# Patient Record
Sex: Male | Born: 1965 | Race: Black or African American | Hispanic: No | Marital: Single | Smoking: Never smoker
Health system: Southern US, Community
[De-identification: ages and names within clinical notes are randomized; demographics above are authoritative.]

## PROBLEM LIST (undated history)

## (undated) DIAGNOSIS — T79A22A Traumatic compartment syndrome of left lower extremity, initial encounter: Secondary | ICD-10-CM

## (undated) DIAGNOSIS — I10 Essential (primary) hypertension: Secondary | ICD-10-CM

## (undated) DIAGNOSIS — N289 Disorder of kidney and ureter, unspecified: Secondary | ICD-10-CM

## (undated) HISTORY — PX: COMPARTMENT SYNDROME MEASUREMENT: CATH118296

---

## 2001-05-15 ENCOUNTER — Emergency Department (HOSPITAL_COMMUNITY): Admission: EM | Admit: 2001-05-15 | Discharge: 2001-05-16 | Payer: Self-pay | Admitting: Emergency Medicine

## 2001-06-25 ENCOUNTER — Encounter: Payer: Self-pay | Admitting: Internal Medicine

## 2001-06-25 ENCOUNTER — Emergency Department (HOSPITAL_COMMUNITY): Admission: EM | Admit: 2001-06-25 | Discharge: 2001-06-25 | Payer: Self-pay | Admitting: Internal Medicine

## 2003-01-24 ENCOUNTER — Emergency Department (HOSPITAL_COMMUNITY): Admission: EM | Admit: 2003-01-24 | Discharge: 2003-01-25 | Payer: Self-pay | Admitting: *Deleted

## 2003-07-18 ENCOUNTER — Emergency Department (HOSPITAL_COMMUNITY): Admission: AC | Admit: 2003-07-18 | Discharge: 2003-07-19 | Payer: Self-pay

## 2004-05-09 ENCOUNTER — Emergency Department (HOSPITAL_COMMUNITY): Admission: EM | Admit: 2004-05-09 | Discharge: 2004-05-09 | Payer: Self-pay | Admitting: Emergency Medicine

## 2004-07-31 ENCOUNTER — Ambulatory Visit (HOSPITAL_COMMUNITY): Admission: RE | Admit: 2004-07-31 | Discharge: 2004-07-31 | Payer: Self-pay | Admitting: Internal Medicine

## 2008-04-08 ENCOUNTER — Emergency Department (HOSPITAL_COMMUNITY): Admission: EM | Admit: 2008-04-08 | Discharge: 2008-04-09 | Payer: Self-pay | Admitting: Emergency Medicine

## 2008-11-08 ENCOUNTER — Encounter: Admission: RE | Admit: 2008-11-08 | Discharge: 2008-11-08 | Payer: Self-pay | Admitting: Sports Medicine

## 2010-06-13 LAB — URINE CULTURE
Colony Count: NO GROWTH
Culture: NO GROWTH

## 2010-06-13 LAB — URINALYSIS, ROUTINE W REFLEX MICROSCOPIC
Glucose, UA: NEGATIVE mg/dL
Ketones, ur: NEGATIVE mg/dL
Nitrite: NEGATIVE
pH: 6.5 (ref 5.0–8.0)

## 2012-05-27 ENCOUNTER — Emergency Department (HOSPITAL_COMMUNITY)
Admission: EM | Admit: 2012-05-27 | Discharge: 2012-05-28 | Disposition: A | Discharge status: Home / Self Care | Payer: Self-pay | Attending: Emergency Medicine | Admitting: Emergency Medicine

## 2012-05-27 ENCOUNTER — Encounter (HOSPITAL_COMMUNITY): Payer: Self-pay

## 2012-05-27 DIAGNOSIS — L259 Unspecified contact dermatitis, unspecified cause: Secondary | ICD-10-CM | POA: Insufficient documentation

## 2012-05-28 MED ORDER — HYDROXYZINE HCL 25 MG PO TABS
25.0000 mg | ORAL_TABLET | Freq: Once | ORAL | Status: AC
  Administered 2012-05-28: 25 mg via ORAL
  Filled 2012-05-28: qty 1

## 2012-05-28 MED ORDER — HYDROXYZINE HCL 25 MG PO TABS: 25.0000 mg | ORAL_TABLET | Freq: Four times a day (QID) | ORAL | Status: DC | PRN

## 2012-05-28 MED ORDER — PREDNISONE 50 MG PO TABS
60.0000 mg | ORAL_TABLET | Freq: Once | ORAL | Status: AC
  Administered 2012-05-28: 60 mg via ORAL
  Filled 2012-05-28: qty 1

## 2012-05-28 MED ORDER — PREDNISONE 10 MG PO TABS: ORAL_TABLET | ORAL | Status: DC

## 2012-05-28 NOTE — ED Notes (Signed)
Instructions, prescriptions reviewed and f/u information provided - verbalizes understanding.

## 2012-05-28 NOTE — ED Provider Notes (Signed)
History     CSN: [id number]  Arrival date & time 05/27/12  2347   First MD Initiated Contact with Patient 05/28/12 0008      Chief Complaint  Patient presents with  . Rash    (Consider location/radiation/quality/duration/timing/severity/associated sxs/prior treatment) HPI Comments: Patient c/o red itchy rash to most of his body for several weeks.  States the rash began after using a different brand of detergent.  He states he has used OTC anti-itch medication, hydrocortisone cream, and benadryl w/o relief.  He denies swelling, difficulty swallowing or breathing, exposure to similar rash or recent illness.    Patient is a 47 y.o. male presenting with rash. The history is provided by the patient.  Rash Location:  Full body Quality: itchiness and redness   Quality: not blistering, not bruising, not burning, not draining, not dry, not painful, not peeling, not scaling, not swelling and not weeping   Severity:  Mild Onset quality:  Gradual Timing:  Constant Progression:  Unchanged Chronicity:  New Context: new detergent/soap   Context: not exposure to similar rash, not hot tub use, not insect bite/sting, not medications, not plant contact and not sick contacts   Relieved by:  Nothing Worsened by:  Heat Ineffective treatments:  Anti-itch cream and OTC analgesics Associated symptoms: no abdominal pain, no fever, no headaches, no joint pain, no myalgias, no nausea, no shortness of breath, no sore throat, no throat swelling, no tongue swelling, no URI, not vomiting and not wheezing     History reviewed. No pertinent past medical history.  History reviewed. No pertinent past surgical history.  No family history on file.  History  Substance Use Topics  . Smoking status: Never Smoker   . Smokeless tobacco: Not on file  . Alcohol Use: Yes      Review of Systems  Constitutional: Negative for fever, chills, activity change and appetite change.  HENT: Negative for sore throat,  facial swelling, trouble swallowing, neck pain and neck stiffness.   Respiratory: Negative for chest tightness, shortness of breath and wheezing.   Cardiovascular: Negative for chest pain and leg swelling.  Gastrointestinal: Negative for nausea, vomiting and abdominal pain.  Musculoskeletal: Negative for myalgias and arthralgias.  Skin: Positive for rash. Negative for wound.  Neurological: Negative for dizziness, speech difficulty, weakness, numbness and headaches.  All other systems reviewed and are negative.    Allergies  Review of patient's allergies indicates no known allergies.  Home Medications  No current outpatient prescriptions on file.  BP 168/108  Pulse 112  Temp(Src) 98 F (36.7 C) (Oral)  Resp 20  Ht 5\' 5"  (1.651 m)  Wt 225 lb (102.059 kg)  BMI 37.44 kg/m2  SpO2 96%  Physical Exam  Nursing note and vitals reviewed. Constitutional: He is oriented to person, place, and time. He appears well-developed and well-nourished. No distress.  HENT:  Head: Normocephalic and atraumatic.  Mouth/Throat: Oropharynx is clear and moist.  Eyes: EOM are normal. Pupils are equal, round, and reactive to light.  Neck: Normal range of motion. Neck supple.  Cardiovascular: Normal rate, regular rhythm, normal heart sounds and intact distal pulses.   No murmur heard. Pulmonary/Chest: Effort normal and breath sounds normal.  Musculoskeletal: He exhibits no edema and no tenderness.  Lymphadenopathy:    He has no cervical adenopathy.  Neurological: He is alert and oriented to person, place, and time. He exhibits normal muscle tone. Coordination normal.  Skin: Rash noted. There is erythema.  Erythematous maculopapular rash to the extremities,  back and chest.  Palms, sole of feet and face are spared.  No vesicles, pustules or peticheia      ED Course  Procedures (including critical care time)  Labs Reviewed - No data to display No results found.      MDM   Diffuse maculopapular  rash to most of the body.  Several areas of excoriations.  Appears c/w contact dermatitis.  No edema.  Airway patent.    I will treat with prednisone taper and vistaril.  He agrees to d/c the OTC medications and to f/u with his PMD for recheck.  The patient appears reasonably screened and/or stabilized for discharge and I doubt any other medical condition or other [facility] requiring further screening, evaluation, or treatment in the ED at this time prior to discharge.        Jocelyn Nold L. Avaline Stillson, PA-C 05/28/12 0034

## 2012-05-28 NOTE — ED Provider Notes (Signed)
Medical screening examination/treatment/procedure(s) were performed by non-physician practitioner and as supervising physician I was immediately available for consultation/collaboration.   Joya Gaskins, MD 05/28/12 [phone]

## 2012-06-08 ENCOUNTER — Encounter (HOSPITAL_COMMUNITY): Payer: Self-pay | Admitting: *Deleted

## 2012-06-08 ENCOUNTER — Emergency Department (HOSPITAL_COMMUNITY)
Admission: EM | Admit: 2012-06-08 | Discharge: 2012-06-09 | Disposition: A | Discharge status: Home / Self Care | Payer: Self-pay | Attending: Emergency Medicine | Admitting: Emergency Medicine

## 2012-06-08 DIAGNOSIS — L739 Follicular disorder, unspecified: Secondary | ICD-10-CM

## 2012-06-08 DIAGNOSIS — L738 Other specified follicular disorders: Secondary | ICD-10-CM | POA: Insufficient documentation

## 2012-06-08 DIAGNOSIS — Z872 Personal history of diseases of the skin and subcutaneous tissue: Secondary | ICD-10-CM | POA: Insufficient documentation

## 2012-06-08 DIAGNOSIS — R000 Tachycardia, unspecified: Secondary | ICD-10-CM | POA: Insufficient documentation

## 2012-06-08 NOTE — ED Notes (Signed)
Pt c/o itchy rash on abdomen and groin x 2 months.  Was seen at [facility], given prednisone and "medicine for itching".  States rash is not improving but spreading.

## 2012-06-09 MED ORDER — CEPHALEXIN 250 MG PO CAPS
500.0000 mg | ORAL_CAPSULE | Freq: Once | ORAL | Status: AC
  Administered 2012-06-09: 500 mg via ORAL
  Filled 2012-06-09: qty 2

## 2012-06-09 MED ORDER — CEPHALEXIN 500 MG PO CAPS: 500.0000 mg | ORAL_CAPSULE | Freq: Four times a day (QID) | ORAL | Status: DC

## 2012-06-09 MED ORDER — HYDROXYZINE HCL 25 MG PO TABS: 25.0000 mg | ORAL_TABLET | Freq: Four times a day (QID) | ORAL | Status: DC | PRN

## 2012-06-09 NOTE — ED Provider Notes (Signed)
History     CSN: [id number]  Arrival date & time 06/08/12  2124   First MD Initiated Contact with Patient 06/08/12 2334      Chief Complaint  Patient presents with  . Rash    (Consider location/radiation/quality/duration/timing/severity/associated sxs/prior treatment) HPI Comments: Patient has a periodic rash.  That started in his perineal area, and groin.  2 months ago.  It is spread to his abdomen and buttock under his arms in the axilla.  [facility] with a contact dermatitis started on Vistaril and steroids, which he's completed the course states, that Vistaril did help the itching, but this year.  I did nothing to resolve.  The rash.  Denies any generalized, fever, body aches  Patient is a 47 y.o. male presenting with rash. The history is provided by the patient.  Rash Location:  Ano-genital Associated symptoms: no fever and no myalgias     History reviewed. No pertinent past medical history.  History reviewed. No pertinent past surgical history.  History reviewed. No pertinent family history.  History  Substance Use Topics  . Smoking status: Never Smoker   . Smokeless tobacco: Not on file  . Alcohol Use: Yes      Review of Systems  Constitutional: Negative for fever and chills.  Musculoskeletal: Negative for myalgias.  Skin: Positive for rash.  All other systems reviewed and are negative.    Allergies  Review of patient's allergies indicates no known allergies.  Home Medications   Current Outpatient Rx  Name  Route  Sig  Dispense  Refill  . naproxen sodium (ANAPROX) 220 MG tablet   Oral   Take 440 mg by mouth 2 (two) times daily as needed (for pain).         . cephALEXin (KEFLEX) 500 MG capsule   Oral   Take 1 capsule (500 mg total) by mouth 4 (four) times daily.   28 capsule   0   . hydrOXYzine (ATARAX/VISTARIL) 25 MG tablet   Oral   Take 1 tablet (25 mg total) by mouth every 6 (six) hours as needed for  itching.   15 tablet   0     BP 149/98  Pulse 112  Temp(Src) 98.9 F (37.2 C) (Oral)  Resp 15  SpO2 95%  Physical Exam  Constitutional: He appears well-developed and well-nourished.  Obese  HENT:  Head: Normocephalic.  Eyes: Pupils are equal, round, and reactive to light.  Neck: Normal range of motion.  Cardiovascular: Tachycardia present.   Pulmonary/Chest: Effort normal.  Abdominal: Soft. Bowel sounds are normal. He exhibits no distension. There is no tenderness.  Musculoskeletal: Normal range of motion.  Neurological: He is alert.  Skin: Skin is warm and dry. No erythema.  Patient has several scabbed over areas on his buttock from scratching.  The rash seems to be at the base of hair shafts in his pelvic region.  Inner thighs, leading me to believe that this is a folliculitis.  We'll treat with antibiotic    ED Course  Procedures (including critical care time)  Labs Reviewed - No data to display No results found.   1. Folliculitis       MDM   We'll start Keflex 500 mg 3 times a day to treat folliculitis and encourage patient to followup with his primary care physician        Arman Filter, NP 06/09/12 [phone]

## 2012-06-09 NOTE — ED Provider Notes (Signed)
Medical screening examination/treatment/procedure(s) were performed by non-physician practitioner and as supervising physician I was immediately available for consultation/collaboration.  Rosaisela Jamroz M Jenilyn Magana, MD 06/09/12 0625 

## 2012-06-09 NOTE — ED Notes (Addendum)
The patient is AOx4 and comfortable with his discharge instructions. 

## 2012-12-05 ENCOUNTER — Emergency Department (HOSPITAL_COMMUNITY): Payer: Self-pay

## 2012-12-05 ENCOUNTER — Encounter (HOSPITAL_COMMUNITY): Payer: Self-pay | Admitting: Emergency Medicine

## 2012-12-05 DIAGNOSIS — I10 Essential (primary) hypertension: Secondary | ICD-10-CM | POA: Diagnosis present

## 2012-12-05 DIAGNOSIS — T79A29A Traumatic compartment syndrome of unspecified lower extremity, initial encounter: Principal | ICD-10-CM | POA: Diagnosis present

## 2012-12-05 DIAGNOSIS — W19XXXA Unspecified fall, initial encounter: Secondary | ICD-10-CM | POA: Diagnosis present

## 2012-12-05 DIAGNOSIS — M216X9 Other acquired deformities of unspecified foot: Secondary | ICD-10-CM | POA: Diagnosis present

## 2012-12-05 LAB — CBC WITH DIFFERENTIAL/PLATELET
Basophils Relative: 0 % (ref 0–1)
Eosinophils Absolute: 0.1 10*3/uL (ref 0.0–0.7)
Eosinophils Relative: 1 % (ref 0–5)
Hemoglobin: 14.9 g/dL (ref 13.0–17.0)
Lymphs Abs: 3.3 10*3/uL (ref 0.7–4.0)
MCH: 29.7 pg (ref 26.0–34.0)
MCHC: 34.5 g/dL (ref 30.0–36.0)
MCV: 86.1 fL (ref 78.0–100.0)
Monocytes Relative: 5 % (ref 3–12)
Neutrophils Relative %: 78 % — ABNORMAL HIGH (ref 43–77)
Platelets: 252 10*3/uL (ref 150–400)
RBC: 5.02 MIL/uL (ref 4.22–5.81)

## 2012-12-05 LAB — COMPREHENSIVE METABOLIC PANEL
Albumin: 3.9 g/dL (ref 3.5–5.2)
BUN: 19 mg/dL (ref 6–23)
Calcium: 8.8 mg/dL (ref 8.4–10.5)
GFR calc Af Amer: 49 mL/min — ABNORMAL LOW (ref >90)
Glucose, Bld: 157 mg/dL — ABNORMAL HIGH (ref 70–99)
Total Protein: 7.6 g/dL (ref 6.0–8.3)

## 2012-12-05 MED ORDER — OXYCODONE-ACETAMINOPHEN 5-325 MG PO TABS
1.0000 | ORAL_TABLET | Freq: Once | ORAL | Status: AC
  Administered 2012-12-05: 1 via ORAL
  Filled 2012-12-05: qty 1

## 2012-12-05 NOTE — ED Notes (Signed)
Fall from ladder. approx 98feet. Into bushes. Possible to head. PT a/o at this time. Standing with difficulty. PT states that he was drinking etoh earlier (1/2-1 pint). Does NOT appear intoxicated at this time. Nausea present. No emesis.  NO CP, SOB, obvious deformity. Left leg pain

## 2012-12-06 ENCOUNTER — Inpatient Hospital Stay (HOSPITAL_COMMUNITY)
Admission: EM | Admit: 2012-12-06 | Discharge: 2012-12-08 | DRG: 909 | Disposition: A | Discharge status: Home / Self Care | Payer: Self-pay | Attending: Orthopaedic Surgery | Admitting: Orthopaedic Surgery

## 2012-12-06 ENCOUNTER — Encounter (HOSPITAL_COMMUNITY)
Admission: EM | Disposition: A | Discharge status: Home / Self Care | Payer: Self-pay | Source: Home / Self Care | Attending: Orthopaedic Surgery

## 2012-12-06 ENCOUNTER — Encounter (HOSPITAL_COMMUNITY): Payer: Self-pay | Admitting: Anesthesiology

## 2012-12-06 ENCOUNTER — Emergency Department (HOSPITAL_COMMUNITY): Payer: Self-pay

## 2012-12-06 ENCOUNTER — Emergency Department (HOSPITAL_COMMUNITY): Payer: Self-pay | Admitting: Anesthesiology

## 2012-12-06 ENCOUNTER — Encounter (HOSPITAL_COMMUNITY): Payer: Self-pay | Admitting: Radiology

## 2012-12-06 DIAGNOSIS — T79A22A Traumatic compartment syndrome of left lower extremity, initial encounter: Secondary | ICD-10-CM

## 2012-12-06 HISTORY — PX: I&D EXTREMITY: SHX5045

## 2012-12-06 HISTORY — DX: Essential (primary) hypertension: I10

## 2012-12-06 SURGERY — IRRIGATION AND DEBRIDEMENT EXTREMITY
Anesthesia: General | Site: Leg Lower | Laterality: Left | Wound class: Clean

## 2012-12-06 MED ORDER — NEOSTIGMINE METHYLSULFATE 1 MG/ML IJ SOLN
INTRAMUSCULAR | Status: DC | PRN
  Administered 2012-12-06: 5 mg via INTRAVENOUS

## 2012-12-06 MED ORDER — MIDAZOLAM HCL 5 MG/5ML IJ SOLN
INTRAMUSCULAR | Status: DC | PRN
  Administered 2012-12-06: 2 mg via INTRAVENOUS

## 2012-12-06 MED ORDER — HYDROMORPHONE HCL PF 1 MG/ML IJ SOLN
1.0000 mg | Freq: Once | INTRAMUSCULAR | Status: AC
  Administered 2012-12-06: 1 mg via INTRAVENOUS

## 2012-12-06 MED ORDER — DEXTROSE 5 % IV SOLN
INTRAVENOUS | Status: DC | PRN
  Administered 2012-12-06: 05:00:00 via INTRAVENOUS

## 2012-12-06 MED ORDER — ONDANSETRON HCL 4 MG/2ML IJ SOLN
INTRAMUSCULAR | Status: DC | PRN
  Administered 2012-12-06: 4 mg via INTRAMUSCULAR

## 2012-12-06 MED ORDER — SODIUM CHLORIDE 0.9 % IR SOLN
Status: DC | PRN
  Administered 2012-12-06: 1000 mL

## 2012-12-06 MED ORDER — MEPERIDINE HCL 25 MG/ML IJ SOLN: 6.2500 mg | INTRAMUSCULAR | Status: DC | PRN

## 2012-12-06 MED ORDER — HYDROMORPHONE HCL PF 1 MG/ML IJ SOLN
0.5000 mg | Freq: Once | INTRAMUSCULAR | Status: DC
  Filled 2012-12-06: qty 1

## 2012-12-06 MED ORDER — CEFAZOLIN SODIUM-DEXTROSE 2-3 GM-% IV SOLR
INTRAVENOUS | Status: AC
  Filled 2012-12-06: qty 50

## 2012-12-06 MED ORDER — HYDROMORPHONE HCL PF 1 MG/ML IJ SOLN
0.2500 mg | INTRAMUSCULAR | Status: DC | PRN
  Administered 2012-12-06 (×2): 0.5 mg via INTRAVENOUS

## 2012-12-06 MED ORDER — OXYCODONE HCL 5 MG PO TABS
5.0000 mg | ORAL_TABLET | ORAL | Status: DC | PRN
  Administered 2012-12-06 – 2012-12-08 (×6): 10 mg via ORAL
  Filled 2012-12-06 (×6): qty 2

## 2012-12-06 MED ORDER — ZOLPIDEM TARTRATE 5 MG PO TABS
5.0000 mg | ORAL_TABLET | Freq: Every evening | ORAL | Status: DC | PRN
  Filled 2012-12-06: qty 1

## 2012-12-06 MED ORDER — DEXAMETHASONE SODIUM PHOSPHATE 4 MG/ML IJ SOLN
INTRAMUSCULAR | Status: DC | PRN
  Administered 2012-12-06: 8 mg via INTRAVENOUS

## 2012-12-06 MED ORDER — DIPHENHYDRAMINE HCL 12.5 MG/5ML PO ELIX
12.5000 mg | ORAL_SOLUTION | ORAL | Status: DC | PRN
  Administered 2012-12-08: 25 mg via ORAL
  Filled 2012-12-06: qty 10

## 2012-12-06 MED ORDER — TETANUS-DIPHTH-ACELL PERTUSSIS 5-2.5-18.5 LF-MCG/0.5 IM SUSP
0.5000 mL | Freq: Once | INTRAMUSCULAR | Status: AC
  Administered 2012-12-06: 0.5 mL via INTRAMUSCULAR
  Filled 2012-12-06: qty 0.5

## 2012-12-06 MED ORDER — ONDANSETRON HCL 4 MG/2ML IJ SOLN: 4.0000 mg | Freq: Four times a day (QID) | INTRAMUSCULAR | Status: DC | PRN

## 2012-12-06 MED ORDER — MORPHINE SULFATE 2 MG/ML IJ SOLN: 1.0000 mg | INTRAMUSCULAR | Status: DC | PRN

## 2012-12-06 MED ORDER — METHOCARBAMOL 100 MG/ML IJ SOLN
500.0000 mg | Freq: Four times a day (QID) | INTRAVENOUS | Status: DC | PRN
  Filled 2012-12-06: qty 5

## 2012-12-06 MED ORDER — ARTIFICIAL TEARS OP OINT
TOPICAL_OINTMENT | OPHTHALMIC | Status: DC | PRN
  Administered 2012-12-06: 1 via OPHTHALMIC

## 2012-12-06 MED ORDER — PROPOFOL 10 MG/ML IV BOLUS
INTRAVENOUS | Status: DC | PRN
  Administered 2012-12-06: 110 mg via INTRAVENOUS
  Administered 2012-12-06: 90 mg via INTRAVENOUS

## 2012-12-06 MED ORDER — METOCLOPRAMIDE HCL 5 MG/ML IJ SOLN: 5.0000 mg | Freq: Three times a day (TID) | INTRAMUSCULAR | Status: DC | PRN

## 2012-12-06 MED ORDER — PROPOFOL 10 MG/ML IV BOLUS: INTRAVENOUS | Status: DC | PRN

## 2012-12-06 MED ORDER — VECURONIUM BROMIDE 10 MG IV SOLR
INTRAVENOUS | Status: DC | PRN
  Administered 2012-12-06: 4 mg via INTRAVENOUS

## 2012-12-06 MED ORDER — CEFAZOLIN SODIUM-DEXTROSE 2-3 GM-% IV SOLR
2.0000 g | Freq: Once | INTRAVENOUS | Status: AC
  Administered 2012-12-06: 2 g via INTRAVENOUS

## 2012-12-06 MED ORDER — SODIUM CHLORIDE 0.9 % IV BOLUS (SEPSIS)
1000.0000 mL | Freq: Once | INTRAVENOUS | Status: AC
  Administered 2012-12-06: 1000 mL via INTRAVENOUS

## 2012-12-06 MED ORDER — FENTANYL CITRATE 0.05 MG/ML IJ SOLN
INTRAMUSCULAR | Status: DC | PRN
  Administered 2012-12-06: 50 ug via INTRAVENOUS
  Administered 2012-12-06: 100 ug via INTRAVENOUS
  Administered 2012-12-06: 50 ug via INTRAVENOUS
  Administered 2012-12-06: 100 ug via INTRAVENOUS

## 2012-12-06 MED ORDER — HYDROMORPHONE HCL PF 1 MG/ML IJ SOLN
0.5000 mg | Freq: Once | INTRAMUSCULAR | Status: AC
  Administered 2012-12-06: 0.5 mg via INTRAVENOUS
  Filled 2012-12-06: qty 1

## 2012-12-06 MED ORDER — METOCLOPRAMIDE HCL 10 MG PO TABS: 5.0000 mg | ORAL_TABLET | Freq: Three times a day (TID) | ORAL | Status: DC | PRN

## 2012-12-06 MED ORDER — IOHEXOL 300 MG/ML  SOLN
100.0000 mL | Freq: Once | INTRAMUSCULAR | Status: AC | PRN
  Administered 2012-12-06: 100 mL via INTRAVENOUS

## 2012-12-06 MED ORDER — HYDROMORPHONE HCL PF 1 MG/ML IJ SOLN
INTRAMUSCULAR | Status: AC
  Filled 2012-12-06: qty 1

## 2012-12-06 MED ORDER — OXYCODONE HCL 5 MG PO TABS: 5.0000 mg | ORAL_TABLET | Freq: Once | ORAL | Status: DC | PRN

## 2012-12-06 MED ORDER — SODIUM CHLORIDE 0.9 % IV SOLN: INTRAVENOUS | Status: DC

## 2012-12-06 MED ORDER — HYDROCODONE-ACETAMINOPHEN 5-325 MG PO TABS
1.0000 | ORAL_TABLET | ORAL | Status: DC | PRN
  Administered 2012-12-06 – 2012-12-08 (×4): 2 via ORAL
  Filled 2012-12-06 (×4): qty 2

## 2012-12-06 MED ORDER — OXYCODONE HCL 5 MG/5ML PO SOLN: 5.0000 mg | Freq: Once | ORAL | Status: DC | PRN

## 2012-12-06 MED ORDER — CEFAZOLIN SODIUM 1-5 GM-% IV SOLN
1.0000 g | Freq: Four times a day (QID) | INTRAVENOUS | Status: AC
  Administered 2012-12-06 (×3): 1 g via INTRAVENOUS
  Filled 2012-12-06 (×3): qty 50

## 2012-12-06 MED ORDER — ONDANSETRON HCL 4 MG PO TABS: 4.0000 mg | ORAL_TABLET | Freq: Four times a day (QID) | ORAL | Status: DC | PRN

## 2012-12-06 MED ORDER — SUCCINYLCHOLINE CHLORIDE 20 MG/ML IJ SOLN
INTRAMUSCULAR | Status: DC | PRN
  Administered 2012-12-06: 140 mg via INTRAVENOUS

## 2012-12-06 MED ORDER — METHOCARBAMOL 500 MG PO TABS
500.0000 mg | ORAL_TABLET | Freq: Four times a day (QID) | ORAL | Status: DC | PRN
  Administered 2012-12-06 (×2): 500 mg via ORAL
  Filled 2012-12-06 (×2): qty 1

## 2012-12-06 MED ORDER — ONDANSETRON HCL 4 MG/2ML IJ SOLN: 4.0000 mg | Freq: Once | INTRAMUSCULAR | Status: DC | PRN

## 2012-12-06 MED ORDER — LACTATED RINGERS IV SOLN
INTRAVENOUS | Status: DC | PRN
  Administered 2012-12-06 (×2): via INTRAVENOUS

## 2012-12-06 MED ORDER — LIDOCAINE HCL (CARDIAC) 20 MG/ML IV SOLN
INTRAVENOUS | Status: DC | PRN
  Administered 2012-12-06: 100 mg via INTRAVENOUS

## 2012-12-06 MED ORDER — GLYCOPYRROLATE 0.2 MG/ML IJ SOLN
INTRAMUSCULAR | Status: DC | PRN
  Administered 2012-12-06: 0.6 mg via INTRAVENOUS

## 2012-12-06 SURGICAL SUPPLY — 62 items
BANDAGE CONFORM 3  STR LF (GAUZE/BANDAGES/DRESSINGS) IMPLANT
BANDAGE ELASTIC 3 VELCRO ST LF (GAUZE/BANDAGES/DRESSINGS) IMPLANT
BANDAGE ELASTIC 6 VELCRO ST LF (GAUZE/BANDAGES/DRESSINGS) ×1 IMPLANT
BLADE SURG 10 STRL SS (BLADE) IMPLANT
BNDG COHESIVE 1X5 TAN STRL LF (GAUZE/BANDAGES/DRESSINGS) IMPLANT
BNDG COHESIVE 4X5 TAN STRL (GAUZE/BANDAGES/DRESSINGS) IMPLANT
BNDG COHESIVE 6X5 TAN STRL LF (GAUZE/BANDAGES/DRESSINGS) ×3 IMPLANT
BNDG GAUZE STRTCH 6 (GAUZE/BANDAGES/DRESSINGS) IMPLANT
CLOTH BEACON ORANGE TIMEOUT ST (SAFETY) IMPLANT
CORDS BIPOLAR (ELECTRODE) IMPLANT
COVER SURGICAL LIGHT HANDLE (MISCELLANEOUS) ×2 IMPLANT
CUFF TOURNIQUET SINGLE 18IN (TOURNIQUET CUFF) ×1 IMPLANT
CUFF TOURNIQUET SINGLE 24IN (TOURNIQUET CUFF) IMPLANT
CUFF TOURNIQUET SINGLE 34IN LL (TOURNIQUET CUFF) ×1 IMPLANT
CUFF TOURNIQUET SINGLE 44IN (TOURNIQUET CUFF) IMPLANT
DRAPE ORTHO SPLIT 77X108 STRL (DRAPES) ×2
DRAPE SURG 17X23 STRL (DRAPES) IMPLANT
DRAPE SURG ORHT 6 SPLT 77X108 (DRAPES) ×2 IMPLANT
DRAPE U-SHAPE 47X51 STRL (DRAPES) ×2 IMPLANT
DRSG PAD ABDOMINAL 8X10 ST (GAUZE/BANDAGES/DRESSINGS) ×1 IMPLANT
DURAPREP 26ML APPLICATOR (WOUND CARE) ×2 IMPLANT
ELECT CAUTERY BLADE 6.4 (BLADE) IMPLANT
ELECT REM PT RETURN 9FT ADLT (ELECTROSURGICAL)
ELECTRODE REM PT RTRN 9FT ADLT (ELECTROSURGICAL) IMPLANT
EVACUATOR 1/8 PVC DRAIN (DRAIN) ×2 IMPLANT
GAUZE XEROFORM 1X8 LF (GAUZE/BANDAGES/DRESSINGS) ×2 IMPLANT
GLOVE BIO SURGEON STRL SZ8 (GLOVE) ×2 IMPLANT
GLOVE BIOGEL PI IND STRL 7.5 (GLOVE) ×1 IMPLANT
GLOVE BIOGEL PI IND STRL 8 (GLOVE) ×2 IMPLANT
GLOVE BIOGEL PI INDICATOR 7.5 (GLOVE) ×1
GLOVE BIOGEL PI INDICATOR 8 (GLOVE) ×2
GLOVE ORTHO TXT STRL SZ7.5 (GLOVE) ×3 IMPLANT
GLOVE SURG SS PI 7.5 STRL IVOR (GLOVE) ×1 IMPLANT
GOWN PREVENTION PLUS LG XLONG (DISPOSABLE) ×4 IMPLANT
GOWN PREVENTION PLUS XLARGE (GOWN DISPOSABLE) ×4 IMPLANT
GOWN STRL NON-REIN LRG LVL3 (GOWN DISPOSABLE) ×1 IMPLANT
HANDPIECE INTERPULSE COAX TIP (DISPOSABLE)
KIT BASIN OR (CUSTOM PROCEDURE TRAY) ×2 IMPLANT
KIT ROOM TURNOVER OR (KITS) ×2 IMPLANT
MANIFOLD NEPTUNE II (INSTRUMENTS) ×1 IMPLANT
NS IRRIG 1000ML POUR BTL (IV SOLUTION) ×2 IMPLANT
PACK ORTHO EXTREMITY (CUSTOM PROCEDURE TRAY) ×2 IMPLANT
PAD ARMBOARD 7.5X6 YLW CONV (MISCELLANEOUS) ×3 IMPLANT
PADDING CAST ABS 4INX4YD NS (CAST SUPPLIES)
PADDING CAST ABS COTTON 4X4 ST (CAST SUPPLIES) ×2 IMPLANT
PADDING CAST COTTON 6X4 STRL (CAST SUPPLIES) ×2 IMPLANT
SET HNDPC FAN SPRY TIP SCT (DISPOSABLE) IMPLANT
SPONGE GAUZE 4X4 12PLY (GAUZE/BANDAGES/DRESSINGS) ×2 IMPLANT
SPONGE LAP 18X18 X RAY DECT (DISPOSABLE) ×2 IMPLANT
STOCKINETTE IMPERVIOUS 9X36 MD (GAUZE/BANDAGES/DRESSINGS) IMPLANT
SUT ETHILON 2 0 FS 18 (SUTURE) IMPLANT
SUT ETHILON 2 0 PSLX (SUTURE) ×3 IMPLANT
SUT ETHILON 3 0 PS 1 (SUTURE) IMPLANT
SYR CONTROL 10ML LL (SYRINGE) IMPLANT
TOWEL OR 17X24 6PK STRL BLUE (TOWEL DISPOSABLE) ×2 IMPLANT
TOWEL OR 17X26 10 PK STRL BLUE (TOWEL DISPOSABLE) ×2 IMPLANT
TUBE ANAEROBIC SPECIMEN COL (MISCELLANEOUS) IMPLANT
TUBE CONNECTING 12X1/4 (SUCTIONS) ×3 IMPLANT
TUBE FEEDING 5FR 15 INCH (TUBING) IMPLANT
UNDERPAD 30X30 INCONTINENT (UNDERPADS AND DIAPERS) ×2 IMPLANT
WATER STERILE IRR 1000ML POUR (IV SOLUTION) ×1 IMPLANT
YANKAUER SUCT BULB TIP NO VENT (SUCTIONS) ×2 IMPLANT

## 2012-12-06 NOTE — Progress Notes (Signed)
Patient ambulated to bathroom x1 assistance and tolerated well.  Ice applied and elevated LLE.  Nsg to continue to monitor for status changes.

## 2012-12-06 NOTE — Anesthesia Preprocedure Evaluation (Signed)
Anesthesia Evaluation  Patient identified by MRN, date of birth, ID band Patient awake    Reviewed: Allergy & Precautions, H&P , NPO status , Patient's Chart, lab work & pertinent test results  Airway Mallampati: I TM Distance: >3 FB Neck ROM: Full    Dental   Pulmonary          Cardiovascular hypertension, Pt. on medications     Neuro/Psych    GI/Hepatic   Endo/Other    Renal/GU      Musculoskeletal   Abdominal   Peds  Hematology   Anesthesia Other Findings   Reproductive/Obstetrics                           Anesthesia Physical Anesthesia Plan  ASA: II and emergent  Anesthesia Plan: General   Post-op Pain Management:    Induction: Intravenous  Airway Management Planned: Oral ETT  Additional Equipment:   Intra-op Plan:   Post-operative Plan: Extubation in OR  Informed Consent: I have reviewed the patients History and Physical, chart, labs and discussed the procedure including the risks, benefits and alternatives for the proposed anesthesia with the patient or authorized representative who has indicated his/her understanding and acceptance.     Plan Discussed with: CRNA and Surgeon  Anesthesia Plan Comments:         Anesthesia Quick Evaluation  

## 2012-12-06 NOTE — H&P (Signed)
  Cody Huynh has evolving compartment syndrome of his left leg and is in need of emergent left leg fasciotomies.  He understands this fully. For further details, please refer to his Consult Note that I just did in EPIC.

## 2012-12-06 NOTE — Progress Notes (Signed)
Subjective: Day of Surgery Procedure(s) (LRB): Two Compartment Fasciotomy (Left) Patient reports pain as moderate.    Objective: Vital signs in last 24 hours: Temp:  [97.6 F (36.4 C)-98.8 F (37.1 C)] 98.1 F (36.7 C) (10/11 0640) Pulse Rate:  [81-117] 81 (10/11 0640) Resp:  [14-18] 16 (10/11 0640) BP: (90-163)/(64-99) 151/87 mmHg (10/11 0640) SpO2:  [91 %-97 %] 97 % (10/11 0640) Weight:  [104.327 kg (230 lb)] 104.327 kg (230 lb) (10/11 0409)  Intake/Output from previous day: 10/10 0701 - 10/11 0700 In: 2420 [P.O.:120; I.V.:2300] Out: 100 [Blood:100] Intake/Output this shift: Total I/O In: 240 [P.O.:240] Out: 300 [Urine:300]   Recent Labs  12/05/12 2137  HGB 14.9    Recent Labs  12/05/12 2137  WBC 19.9*  RBC 5.02  HCT 43.2  PLT 252    Recent Labs  12/05/12 2137  NA 141  K 3.6  CL 102  CO2 18*  BUN 19  CREATININE 1.85*  GLUCOSE 157*  CALCIUM 8.8   No results found for this basename: LABPT, INR,  in the last 72 hours Left leg: Intact pulses distally Incision: dressing C/D/I Compartment soft Sensation decreased throughout foot Plantar flexion of toes and foot intact. Footdrop still present  Assessment/Plan: Day of Surgery Procedure(s) (LRB): Two Compartment Fasciotomy (Left) Up with therapy  CLARK, GILBERT 12/06/2012, 9:23 AM

## 2012-12-06 NOTE — ED Provider Notes (Signed)
CSN: [id number]     Arrival date & time 12/05/12  2114 History   First MD Initiated Contact with Patient 12/06/12 0045     Chief Complaint  Patient presents with  . Fall   (Consider location/radiation/quality/duration/timing/severity/associated sxs/prior Treatment) HPI 47 yo male presents to the ER from home after fall from ladder.  Pt reports he was cleaning out his gutters this afternoon and fell into the bushes, landing on his left side.  He denies striking his head, no LOC.  He does admit to drinking this afternoon, but has since sobered.  Family convinced him to come to the ER due to pain.  Pt c/o pain worse in left lower leg.  He has been able to walk, but with pain.  Unknown last tetanus  Past Medical History  Diagnosis Date  . Hypertension    History reviewed. No pertinent past surgical history. History reviewed. No pertinent family history. History  Substance Use Topics  . Smoking status: Never Smoker   . Smokeless tobacco: Not on file  . Alcohol Use: Yes    Review of Systems  All other systems reviewed and are negative.    Allergies  Review of patient's allergies indicates no known allergies.  Home Medications   Current Outpatient Rx  Name  Route  Sig  Dispense  Refill  . HYDROcodone-acetaminophen (VICODIN) 5-500 MG per tablet   Oral   Take 1 tablet by mouth once.          BP 90/64  Pulse 100  Temp(Src) 98.8 F (37.1 C) (Oral)  Resp 18  SpO2 91% Physical Exam  Nursing note and vitals reviewed. Constitutional: He is oriented to person, place, and time. He appears well-developed and well-nourished. He appears distressed.  HENT:  Head: Normocephalic and atraumatic.  Right Ear: External ear normal.  Left Ear: External ear normal.  Nose: Nose normal.  Mouth/Throat: Oropharynx is clear and moist.  Eyes: Conjunctivae and EOM are normal. Pupils are equal, round, and reactive to light. Right eye exhibits no discharge. Left eye exhibits no discharge.  Neck:  Normal range of motion. Neck supple. No JVD present. No tracheal deviation present. No thyromegaly present.  Cardiovascular: Normal rate, regular rhythm, normal heart sounds and intact distal pulses.  Exam reveals no gallop and no friction rub.   No murmur heard. Pulmonary/Chest: Effort normal and breath sounds normal. No stridor. No respiratory distress. He has no wheezes. He has no rales. He exhibits no tenderness.  Abdominal: Soft. Bowel sounds are normal. He exhibits no distension and no mass. There is no tenderness. There is no rebound and no guarding.  Musculoskeletal:  Pt with decreased ROM to left lower leg, pain with plantar/dorsiflexion.  Swelling noted to left lower leg, pain with palpation of the area, firm anterio-lateral compartments.  Intact sensation, pulses distally  Lymphadenopathy:    He has no cervical adenopathy.  Neurological: He is alert and oriented to person, place, and time. He exhibits normal muscle tone. Coordination normal.  Skin: Skin is warm and dry. No rash noted. No erythema. No pallor.  Multiple abrasions to arms, legs    ED Course  Procedures (including critical care time) Labs Review Labs Reviewed  CBC WITH DIFFERENTIAL - Abnormal; Notable for the following:    WBC 19.9 (*)    Neutrophils Relative % 78 (*)    Neutro Abs 15.4 (*)    All other components within normal limits  COMPREHENSIVE METABOLIC PANEL - Abnormal; Notable for the following:  CO2 18 (*)    Glucose, Bld 157 (*)    Creatinine, Ser 1.85 (*)    AST 56 (*)    ALT 66 (*)    GFR calc non Af Amer 42 (*)    GFR calc Af Amer 49 (*)    All other components within normal limits   Imaging Review Dg Hip Complete Left  12/05/2012   *RADIOLOGY REPORT*  Clinical Data: Status post fall from ladder; left leg pain.  LEFT HIP - COMPLETE 2+ VIEW  Comparison: None.  Findings: There is no evidence of fracture or dislocation.  Both femoral heads are seated normally within their respective acetabula.   The proximal left femur appears intact.  No significant degenerative change is appreciated.  The sacroiliac joints are unremarkable in appearance.  The visualized bowel gas pattern is grossly unremarkable in appearance.  IMPRESSION: No evidence of fracture or dislocation.   Original Report Authenticated By: Tonia Ghent, M.D.   Dg Tibia/fibula Left  12/05/2012   *RADIOLOGY REPORT*  Clinical Data: Status post fall from ladder; diffuse left lower leg pain and abrasions.  LEFT TIBIA AND FIBULA - 2 VIEW  Comparison: None.  Findings: There is no evidence of fracture or dislocation.  The tibia and fibula appear intact.  The ankle mortise is incompletely assessed, but appears grossly unremarkable.  The knee joint is incompletely assessed, though no focal abnormalities are seen at the knee joint.  No significant soft tissue abnormalities are characterized on radiograph.  IMPRESSION: No evidence of fracture or dislocation.   Original Report Authenticated By: Tonia Ghent, M.D.   Dg Knee Complete 4 Views Left  12/05/2012   *RADIOLOGY REPORT*  Clinical Data: Status post fall from ladder; left the pain and swelling.  LEFT KNEE - COMPLETE 4+ VIEW  Comparison: None.  Findings: There is no evidence of fracture or dislocation.  The joint spaces are preserved.  No significant degenerative change is seen; the patellofemoral joint is grossly unremarkable in appearance.  A fabella is noted.  No significant joint effusion is seen.  The visualized soft tissues are normal in appearance.  IMPRESSION: No evidence of fracture or dislocation.   Original Report Authenticated By: Tonia Ghent, M.D.   Ct Extrem Lower W Cm Bil  12/06/2012   *RADIOLOGY REPORT*  Clinical Data:  Left lower leg swelling and pain after fall from ladder.  Assess for soft tissue injury.  CT BILATERAL LOWER EXTREMITY WITH CONTRAST  Technique:  Multidetector CT imaging of both lower legs was performed according to the standard protocol following intravenous  contrast administration. Multiplanar CT image reconstructions were also generated.  Contrast: OMNIPAQUE IOHEXOL 300 MG/ML  SOLN  Comparison:  Left tibia/fibula radiographs performed 12/05/2012  Findings:  There is no evidence of fracture or dislocation.  The anterolateral musculature of the left lower leg is somewhat more hypodense than that of the right lower leg, and the internal fat planes are somewhat obscured.  The anterior compartment appears slightly larger than on the right side.  This raises suspicion for compartment syndrome, though compartment syndrome is generally diagnosed on clinical findings.  There is asymmetrically decreased enhancement of the left anterior tibial artery.  The ankle mortise is intact bilaterally.  The visualized portions of both feet are symmetric and unremarkable.  There is a small right knee joint effusion; trace left knee joint fluid remains within normal limits.  The menisci are not well assessed on CT, but no definite focal abnormality is seen.  The medial  collateral ligament appears grossly intact bilaterally.  The lateral collateral ligament complexes are within normal limits. The anterior and posterior cruciate ligaments appear intact bilaterally.  The quadriceps and patellar tendons remain intact bilaterally.  IMPRESSION:  1.  Decreased attenuation and increased prominence of the musculature within the anterior compartment of the left lower leg, with partial obscuration of fat planes and decreased enhancement of the left anterior tibial artery.  This raises suspicion for compartment syndrome, though compartment syndrome is generally diagnosed on clinical findings. 2.  No evidence of fracture or dislocation. 3.  Small right knee joint effusion noted.  These results were called by telephone on 12/06/2012 at 02:17 a.m. to Dr. Marisa Severin, who verbally acknowledged these results.   Original Report Authenticated By: Tonia Ghent, M.D.    EKG Interpretation   None        MDM   1. Compartment syndrome of lower extremity, left, initial encounter    47 yo male s/p fall, swelling to left lower leg, xrays negative.  Concern for occult fracture given pain.  Given swelling, tendon injury or compartment syndrome also in consideration.  Currently NVI, pain with movement passive and active.  Will plan for CT scan for occult fracture.  CT scan with no fracture, but changes c/w possible compartment syndrome.  Pt reassessed, area now more firm, pulses and sensation intact, no improvement in pain with dilaudid.  D/w Dr Magnus Ivan who will see in ED.   Olivia Mackie, MD 12/06/12 [phone]

## 2012-12-06 NOTE — Op Note (Signed)
NAMEKIRT, CHEW NO.:  [id number]  MEDICAL RECORD NO.:  [id number]  LOCATION:  [id number]                        FACILITY:  [facility]  PHYSICIAN:  Vanita Panda. Magnus Ivan, M.D.DATE OF BIRTH:  [birth year]  DATE OF PROCEDURE:  12/06/2012 DATE OF DISCHARGE:                              OPERATIVE REPORT   PREOPERATIVE DIAGNOSIS:  Left leg anterior and lateral compartment syndrome.  POSTOPERATIVE DIAGNOSIS:  Left leg anterior and lateral compartment syndrome.  PROCEDURE:  Left leg 2-compartment fasciotomies of the anterior and lateral compartments.  FINDINGS:  Dusky muscle and tight anterior and lateral compartments.  SURGEON:  Vanita Panda. Magnus Ivan, M.D.  ANESTHESIA:  General.  BLOOD LOSS:  Less than 100 mL.  ANTIBIOTICS:  2 g of IV Ancef.  COMPLICATIONS:  None.  INDICATIONS:  Mr. Cody Huynh is a 47 year old gentleman who last evening around 7:30 p.m. sustained a mechanical fall off a ladder about 12 feet. He had been drinking some alcohol and was reluctant to come to the emergency room in spite of severe left leg pain.  About 2 hours later, he was convinced by the family to show up to the emergency room and he was seen in the triage area by the ER staff.  By 10:30 p.m., x-rays had been obtained that showed no evidence of a fracture.  He was then apparently sent for a CT scan.  After midnight, the CT scan was performed and it showed some edema and swelling in the muscle on the lateral compartment of his leg and that compartment was slightly larger than the other compartment on measurement and the radiologist said this was worrisome for compartment syndrome and recognized again this was a clinical diagnosis.  At 2:30 in the morning, I was called from home as a consultation to rule out compartment syndrome.  When I evaluated the patient by 3 p.m., I found a very hard solid compartment on the lateral aspect of his leg.  He reported numbness in his foot on the  left side. He had palpable pulses, but footdrop.  This is definitely worrisome for compartment syndrome.  I did not feel the necessity to measure his compartments based on my clinical exam findings with very hard compartments and footdrop.  His posterior and medial compartments were very soft.  I talked him about the fact that here we are 7 hours since his injury heading into 9 hours by the time we get into the OR, that the fasciotomy is still warranted because of the evolving nature of this compartment syndrome and that his footdrop, it is still harder to determine whether he will get recovery from the footdrop.  PROCEDURE DESCRIPTION:  After informed consent was obtained, appropriate left leg was marked.  He was brought to the operating room, placed supine on the operating table.  General anesthesia was then obtained.  A nonsterile tourniquet was placed around his upper left thigh, but was not utilized during the case.  His left leg was prepped and draped from the thigh down to the toes with DuraPrep and sterile drapes.  Time-out was called.  He was identified as correct patient and correct left leg. I then made an incision over the lateral compartment  and carried this proximally and distally.  I released the lateral compartment in its entirety and then released the anterior compartment as well.  The muscle on the lateral compartment was dusky and was minimally contractile.  I then explored the muscles and found some tearing of muscle, but no frank bleeder, but definitely edema.  I did not see any evidence of vessel injury from the lateral side.  I then irrigated the tissues completely with normal saline solution and felt that we would end up needing to [facility] the wound, but then after irrigating it fully I was able to use 2-0 nylon sutures in a far-near-near-far format spread throughout the wound to actually close the skin.  I did place a medium Hemovac before I closed this for drainage  purposes.  I then placed Xeroform and well- padded sterile dressing.  He was awakened, extubated, and taken to the recovery room in stable condition.  All final counts were correct. There were no complications noted.  Postoperatively, he will be admitted for observation on IV antibiotics.     Vanita Panda. Magnus Ivan, M.D.     CYB/MEDQ  D:  12/06/2012  T:  12/06/2012  Job:  [id number]

## 2012-12-06 NOTE — Anesthesia Postprocedure Evaluation (Signed)
Anesthesia Post Note  Patient: Cody Huynh  Procedure(s) Performed: Procedure(s) (LRB): Two Compartment Fasciotomy (Left)  Anesthesia type: general  Patient location: PACU  Post pain: Pain level controlled  Post assessment: Patient's Cardiovascular Status Stable  Last Vitals:  Filed Vitals:   12/06/12 0640  BP: 151/87  Pulse: 81  Temp: 36.7 C  Resp: 16    Post vital signs: Reviewed and stable  Level of consciousness: sedated  Complications: No apparent anesthesia complications

## 2012-12-06 NOTE — Transfer of Care (Signed)
Immediate Anesthesia Transfer of Care Note  Patient: Cody Huynh  Procedure(s) Performed: Procedure(s): IRRIGATION AND DEBRIDEMENT EXTREMITY (Left)  Patient Location: PACU  Anesthesia Type:General  Level of Consciousness: oriented, sedated, patient cooperative and responds to stimulation  Airway & Oxygen Therapy: Patient Spontanous Breathing and Patient connected to face mask oxygen  Post-op Assessment: Report given to PACU RN, Post -op Vital signs reviewed and stable, Patient moving all extremities and Patient moving all extremities X 4  Post vital signs: Reviewed and stable  Complications: No apparent anesthesia complications

## 2012-12-06 NOTE — Consult Note (Signed)
Reason for Consult:  Left leg compartment syndrome Referring Physician:   Norlene Campbell, ED MD  Cody Huynh is an 47 y.o. male.  HPI:   47 yo male who came to the ER almost 2 hours after a fall from a ladder.  He had been drinking a little.  After continued left leg pain, his family convinced him to come to the ER for further evaluation and treatment.  Unfortunately, I was not called to evaluate the patient to rule out compartment syndrome in his left leg until 2:30 am this morning after he had already been in the ER for 4+ hours.  He does report left leg pain and left foot numbness.  He denies other injuries.  Past Medical History  Diagnosis Date  . Hypertension     History reviewed. No pertinent past surgical history.  History reviewed. No pertinent family history.  Social History:  reports that he has never smoked. He does not have any smokeless tobacco history on file. He reports that he drinks alcohol. He reports that he does not use illicit drugs.  Allergies: No Known Allergies  Medications: I have reviewed the patient's current medications.  Results for orders placed during the hospital encounter of 12/06/12 (from the past 48 hour(s))  CBC WITH DIFFERENTIAL     Status: Abnormal   Collection Time    12/05/12  9:37 PM      Result Value Range   WBC 19.9 (*) 4.0 - 10.5 K/uL   RBC 5.02  4.22 - 5.81 MIL/uL   Hemoglobin 14.9  13.0 - 17.0 g/dL   HCT 16.1  09.6 - 04.5 %   MCV 86.1  78.0 - 100.0 fL   MCH 29.7  26.0 - 34.0 pg   MCHC 34.5  30.0 - 36.0 g/dL   RDW 40.9  81.1 - 91.4 %   Platelets 252  150 - 400 K/uL   Neutrophils Relative % 78 (*) 43 - 77 %   Neutro Abs 15.4 (*) 1.7 - 7.7 K/uL   Lymphocytes Relative 17  12 - 46 %   Lymphs Abs 3.3  0.7 - 4.0 K/uL   Monocytes Relative 5  3 - 12 %   Monocytes Absolute 1.0  0.1 - 1.0 K/uL   Eosinophils Relative 1  0 - 5 %   Eosinophils Absolute 0.1  0.0 - 0.7 K/uL   Basophils Relative 0  0 - 1 %   Basophils Absolute 0.0  0.0 - 0.1 K/uL    COMPREHENSIVE METABOLIC PANEL     Status: Abnormal   Collection Time    12/05/12  9:37 PM      Result Value Range   Sodium 141  135 - 145 mEq/L   Potassium 3.6  3.5 - 5.1 mEq/L   Chloride 102  96 - 112 mEq/L   CO2 18 (*) 19 - 32 mEq/L   Glucose, Bld 157 (*) 70 - 99 mg/dL   BUN 19  6 - 23 mg/dL   Creatinine, Ser 7.82 (*) 0.50 - 1.35 mg/dL   Calcium 8.8  8.4 - 95.6 mg/dL   Total Protein 7.6  6.0 - 8.3 g/dL   Albumin 3.9  3.5 - 5.2 g/dL   AST 56 (*) 0 - 37 U/L   ALT 66 (*) 0 - 53 U/L   Alkaline Phosphatase 71  39 - 117 U/L   Total Bilirubin 0.3  0.3 - 1.2 mg/dL   GFR calc non Af Amer 42 (*) >90 mL/min  GFR calc Af Amer 49 (*) >90 mL/min   Comment: (NOTE)     The eGFR has been calculated using the CKD EPI equation.     This calculation has not been validated in all clinical situations.     eGFR's persistently <90 mL/min signify possible Chronic Kidney     Disease.    Dg Hip Complete Left  12/05/2012   *RADIOLOGY REPORT*  Clinical Data: Status post fall from ladder; left leg pain.  LEFT HIP - COMPLETE 2+ VIEW  Comparison: None.  Findings: There is no evidence of fracture or dislocation.  Both femoral heads are seated normally within their respective acetabula.  The proximal left femur appears intact.  No significant degenerative change is appreciated.  The sacroiliac joints are unremarkable in appearance.  The visualized bowel gas pattern is grossly unremarkable in appearance.  IMPRESSION: No evidence of fracture or dislocation.   Original Report Authenticated By: Tonia Ghent, M.D.   Dg Tibia/fibula Left  12/05/2012   *RADIOLOGY REPORT*  Clinical Data: Status post fall from ladder; diffuse left lower leg pain and abrasions.  LEFT TIBIA AND FIBULA - 2 VIEW  Comparison: None.  Findings: There is no evidence of fracture or dislocation.  The tibia and fibula appear intact.  The ankle mortise is incompletely assessed, but appears grossly unremarkable.  The knee joint is incompletely  assessed, though no focal abnormalities are seen at the knee joint.  No significant soft tissue abnormalities are characterized on radiograph.  IMPRESSION: No evidence of fracture or dislocation.   Original Report Authenticated By: Tonia Ghent, M.D.   Dg Knee Complete 4 Views Left  12/05/2012   *RADIOLOGY REPORT*  Clinical Data: Status post fall from ladder; left the pain and swelling.  LEFT KNEE - COMPLETE 4+ VIEW  Comparison: None.  Findings: There is no evidence of fracture or dislocation.  The joint spaces are preserved.  No significant degenerative change is seen; the patellofemoral joint is grossly unremarkable in appearance.  A fabella is noted.  No significant joint effusion is seen.  The visualized soft tissues are normal in appearance.  IMPRESSION: No evidence of fracture or dislocation.   Original Report Authenticated By: Tonia Ghent, M.D.   Ct Extrem Lower W Cm Bil  12/06/2012   *RADIOLOGY REPORT*  Clinical Data:  Left lower leg swelling and pain after fall from ladder.  Assess for soft tissue injury.  CT BILATERAL LOWER EXTREMITY WITH CONTRAST  Technique:  Multidetector CT imaging of both lower legs was performed according to the standard protocol following intravenous contrast administration. Multiplanar CT image reconstructions were also generated.  Contrast: OMNIPAQUE IOHEXOL 300 MG/ML  SOLN  Comparison:  Left tibia/fibula radiographs performed 12/05/2012  Findings:  There is no evidence of fracture or dislocation.  The anterolateral musculature of the left lower leg is somewhat more hypodense than that of the right lower leg, and the internal fat planes are somewhat obscured.  The anterior compartment appears slightly larger than on the right side.  This raises suspicion for compartment syndrome, though compartment syndrome is generally diagnosed on clinical findings.  There is asymmetrically decreased enhancement of the left anterior tibial artery.  The ankle mortise is intact  bilaterally.  The visualized portions of both feet are symmetric and unremarkable.  There is a small right knee joint effusion; trace left knee joint fluid remains within normal limits.  The menisci are not well assessed on CT, but no definite focal abnormality is seen.  The medial collateral  ligament appears grossly intact bilaterally.  The lateral collateral ligament complexes are within normal limits. The anterior and posterior cruciate ligaments appear intact bilaterally.  The quadriceps and patellar tendons remain intact bilaterally.  IMPRESSION:  1.  Decreased attenuation and increased prominence of the musculature within the anterior compartment of the left lower leg, with partial obscuration of fat planes and decreased enhancement of the left anterior tibial artery.  This raises suspicion for compartment syndrome, though compartment syndrome is generally diagnosed on clinical findings. 2.  No evidence of fracture or dislocation. 3.  Small right knee joint effusion noted.  These results were called by telephone on 12/06/2012 at 02:17 a.m. to Dr. Marisa Severin, who verbally acknowledged these results.   Original Report Authenticated By: Tonia Ghent, M.D.    Review of Systems  All other systems reviewed and are negative.   Blood pressure 163/99, pulse 117, temperature 98.8 F (37.1 C), temperature source Oral, resp. rate 17, SpO2 97.00%. Physical Exam  Constitutional: He is oriented to person, place, and time. He appears well-developed and well-nourished.  HENT:  Head: Normocephalic and atraumatic.  Eyes: EOM are normal. Pupils are equal, round, and reactive to light.  Neck: Normal range of motion. Neck supple.  Cardiovascular: Normal rate and regular rhythm.   Respiratory: Effort normal and breath sounds normal.  GI: Soft. Bowel sounds are normal.  Musculoskeletal: Normal range of motion.       Legs: Neurological: He is alert and oriented to person, place, and time.  Skin: Skin is warm and  dry.  Psychiatric: He has a normal mood and affect.    Left leg with hard lateral compartment. Numbness left foot Left foot with foot drop - he can not  Dorsiflex his left foot or toes His foot is warm and has a strong posterior tibial pulse  Assessment/Plan: Compartment syndrome left leg lateral compartment 1)  To the OR emergently for a left leg lateral compartment compartment release.  I spoke to him in detail about this.  Unfortunately, the injury occurred about 8 hours ago.  It will take a while to get recovery of his peroneal nerves, if at all.  We will likely place a VAC over the fasciotomy incision if unable to close.  He will be admitted to the floor as an inpatient after surgery.  Informed consent is obtained.  Amalio Loe Y 12/06/2012, 3:23 AM

## 2012-12-06 NOTE — Anesthesia Procedure Notes (Signed)
Procedure Name: Intubation Date/Time: 12/06/2012 4:30 AM Performed by: Wray Kearns A Pre-anesthesia Checklist: Patient identified, Timeout performed, Emergency Drugs available, Suction available and Patient being monitored Patient Re-evaluated:Patient Re-evaluated prior to inductionOxygen Delivery Method: Circle system utilized Preoxygenation: Pre-oxygenation with 100% oxygen Intubation Type: IV induction, Rapid sequence and Cricoid Pressure applied Laryngoscope Size: Mac and 4 Grade View: Grade II Tube type: Oral Tube size: 8.0 mm Number of attempts: 1 Airway Equipment and Method: Stylet Placement Confirmation: ETT inserted through vocal cords under direct vision,  breath sounds checked- equal and bilateral and positive ETCO2 Secured at: 23 cm Tube secured with: Tape Dental Injury: Teeth and Oropharynx as per pre-operative assessment

## 2012-12-06 NOTE — Brief Op Note (Signed)
12/06/2012  5:26 AM  PATIENT:  Cody Huynh  47 y.o. male  PRE-OPERATIVE DIAGNOSIS:  compartment syndrome left leg  POST-OPERATIVE DIAGNOSIS:  Compartment syndrome left leg lateral and anterior compartments  PROCEDURE:  Procedure(s): IRRIGATION AND DEBRIDEMENT EXTREMITY (Left) 2 compartment fasciotomies left leg  SURGEON:  Surgeon(s) and Role:    * Kathryne Hitch, MD - Primary  ASSISTANTS: none   ANESTHESIA:   general  EBL:  Total I/O In: 2250 [I.V.:2250] Out: 100 [Blood:100]  BLOOD ADMINISTERED:none  DRAINS:  Medium hemovac  LOCAL MEDICATIONS USED:  NONE  SPECIMEN:  No Specimen  DISPOSITION OF SPECIMEN:  N/A  COUNTS:  YES  TOURNIQUET:    DICTATION: .Other Dictation: Dictation Number [id number]  PLAN OF CARE: Admit to inpatient   PATIENT DISPOSITION:  PACU - hemodynamically stable.   Delay start of Pharmacological VTE agent (>24hrs) due to surgical blood loss or risk of bleeding: yes

## 2012-12-06 NOTE — Preoperative (Signed)
Beta Blockers   Reason not to administer Beta Blockers:Not Applicable 

## 2012-12-07 LAB — CBC
HCT: 42.5 % (ref 39.0–52.0)
Hemoglobin: 13.9 g/dL (ref 13.0–17.0)
MCH: 29 pg (ref 26.0–34.0)
MCHC: 32.7 g/dL (ref 30.0–36.0)
RBC: 4.79 MIL/uL (ref 4.22–5.81)
WBC: 14.8 10*3/uL — ABNORMAL HIGH (ref 4.0–10.5)

## 2012-12-07 MED ORDER — INFLUENZA VAC SPLIT QUAD 0.5 ML IM SUSP
0.5000 mL | INTRAMUSCULAR | Status: AC
  Administered 2012-12-08: 0.5 mL via INTRAMUSCULAR
  Filled 2012-12-07: qty 0.5

## 2012-12-07 MED ORDER — CEFAZOLIN SODIUM 1-5 GM-% IV SOLN
1.0000 g | Freq: Three times a day (TID) | INTRAVENOUS | Status: DC
  Administered 2012-12-07 – 2012-12-08 (×4): 1 g via INTRAVENOUS
  Filled 2012-12-07 (×6): qty 50

## 2012-12-07 NOTE — Progress Notes (Signed)
Subjective: 1 Day Post-Op Procedure(s) (LRB): Two Compartment Fasciotomy (Left) Patient reports pain as mild.    Objective: Vital signs in last 24 hours: Temp:  [97.5 F (36.4 C)-99.6 F (37.6 C)] 97.5 F (36.4 C) (10/12 0659) Pulse Rate:  [92-100] 92 (10/12 0659) Resp:  [18] 18 (10/12 0659) BP: (144-170)/(85-97) 144/94 mmHg (10/12 0700) SpO2:  [93 %-100 %] 96 % (10/12 0659)  Intake/Output from previous day: 10/11 0701 - 10/12 0700 In: 960 [P.O.:960] Out: 1650 [Urine:1600; Drains:50] Intake/Output this shift: Total I/O In: 240 [P.O.:240] Out: -    Recent Labs  12/05/12 2137  HGB 14.9    Recent Labs  12/05/12 2137  WBC 19.9*  RBC 5.02  HCT 43.2  PLT 252    Recent Labs  12/05/12 2137  NA 141  K 3.6  CL 102  CO2 18*  BUN 19  CREATININE 1.85*  GLUCOSE 157*  CALCIUM 8.8   No results found for this basename: LABPT, INR,  in the last 72 hours  Intact pulses distally Compartment soft Still with foot drop and decreased sensation over foot. Some blistering anterior shin.  Swelling posterior and medial, but not hard and no pain in these areas. Some redness  Assessment/Plan: 1 Day Post-Op Procedure(s) (LRB): Two Compartment Fasciotomy (Left) Continue strict elevation and ice today. Start IV ancef due to mild cellulitis. check CBC   BLACKMAN,CHRISTOPHER Y 12/07/2012, 8:30 AM

## 2012-12-07 NOTE — Progress Notes (Addendum)
Ice applied to LLE, old ice packs in freezer in equipment room.  Patient refused to ambulate greater than 72ft to bathroom.  Educated patient on the consequences of lying in bed all day without ambulating.

## 2012-12-07 NOTE — Progress Notes (Signed)
Ice applied to LLE, pt tolerating well.

## 2012-12-08 MED ORDER — ASPIRIN EC 325 MG PO TBEC: 325.0000 mg | DELAYED_RELEASE_TABLET | Freq: Every day | ORAL | Status: DC

## 2012-12-08 MED ORDER — DOXYCYCLINE HYCLATE 50 MG PO CAPS: 100.0000 mg | ORAL_CAPSULE | Freq: Two times a day (BID) | ORAL | Status: DC

## 2012-12-08 MED ORDER — OXYCODONE-ACETAMINOPHEN 5-325 MG PO TABS: 1.0000 | ORAL_TABLET | Freq: Four times a day (QID) | ORAL | Status: DC | PRN

## 2012-12-08 NOTE — Progress Notes (Signed)
Subjective: 2 Days Post-Op Procedure(s) (LRB): Two Compartment Fasciotomy (Left) Patient reports pain as mild.  Numbness over foot to be expected given compartment syndrome.  Objective: Vital signs in last 24 hours: Temp:  [97.5 F (36.4 C)-98.5 F (36.9 C)] 98.2 F (36.8 C) (10/13 0609) Pulse Rate:  [92-99] 98 (10/13 0609) Resp:  [18] 18 (10/13 0609) BP: (142-169)/(92-102) 169/92 mmHg (10/13 0609) SpO2:  [94 %-96 %] 96 % (10/13 0609)  Intake/Output from previous day: 10/12 0701 - 10/13 0700 In: 990 [P.O.:840; IV Piggyback:150] Out: 700 [Urine:700] Intake/Output this shift: Total I/O In: 750 [P.O.:600; IV Piggyback:150] Out: 700 [Urine:700]   Recent Labs  12/05/12 2137 12/07/12 0900  HGB 14.9 13.9    Recent Labs  12/05/12 2137 12/07/12 0900  WBC 19.9* 14.8*  RBC 5.02 4.79  HCT 43.2 42.5  PLT 252 223    Recent Labs  12/05/12 2137  NA 141  K 3.6  CL 102  CO2 18*  BUN 19  CREATININE 1.85*  GLUCOSE 157*  CALCIUM 8.8   No results found for this basename: LABPT, INR,  in the last 72 hours  Intact pulses distally Incision: scant drainage No cellulitis present Compartment soft Blistering present. Sutures intact. + foot drop and severely decreased sensation left foot.  Assessment/Plan: 2 Days Post-Op Procedure(s) (LRB): Two Compartment Fasciotomy (Left) Discharge to home today.  Kennedy Bohanon Y 12/08/2012, 6:52 AM

## 2012-12-08 NOTE — Discharge Summary (Signed)
Patient ID: QUAYSHAUN HUBBERT MRN: [id number] DOB/AGE: 10-29-1965 47 y.o.  Admit date: 12/06/2012 Discharge date: 12/08/2012  Admission Diagnoses:  Principal Problem:   Compartment syndrome of left lower extremity   Discharge Diagnoses:  Same  Past Medical History  Diagnosis Date  . Hypertension     Surgeries: Procedure(s): Two Compartment Fasciotomy on 12/06/2012   Consultants:    Discharged Condition: Improved  Hospital Course: SEANN GENTHER is an 47 y.o. male who was admitted 12/06/2012 for operative treatment ofCompartment syndrome of left lower extremity. Patient has severe unremitting pain that affects sleep, daily activities, and work/hobbies. After pre-op clearance the patient was taken to the operating room on 12/06/2012 and underwent  Procedure(s): Two Compartment Fasciotomy.    Patient was given perioperative antibiotics: Anti-infectives   Start     Dose/Rate Route Frequency Ordered Stop   12/08/12 0000  doxycycline (VIBRAMYCIN) 50 MG capsule     100 mg Oral 2 times daily 12/08/12 0657     12/07/12 0930  ceFAZolin (ANCEF) IVPB 1 g/50 mL premix     1 g 100 mL/hr over 30 Minutes Intravenous 3 times per day 12/07/12 0829     12/06/12 1000  ceFAZolin (ANCEF) IVPB 1 g/50 mL premix     1 g 100 mL/hr over 30 Minutes Intravenous Every 6 hours 12/06/12 0641 12/06/12 2216   12/06/12 0415  [MAR Hold]  ceFAZolin (ANCEF) IVPB 2 g/50 mL premix     (On MAR Hold since 12/06/12 0454)   2 g 100 mL/hr over 30 Minutes Intravenous  Once 12/06/12 0400 12/06/12 0435       Patient was given sequential compression devices, early ambulation, and chemoprophylaxis to prevent DVT.  Patient benefited maximally from hospital stay and there were no complications.  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  Recent vital signs: Patient Vitals for the past 24 hrs:  BP Temp Pulse Resp SpO2  12/08/12 0609 169/92 mmHg 98.2 F (36.8 C) 98 18 96 %  12/07/12  2219 142/97 mmHg 98.5 F (36.9 C) 99 18 94 %  12/07/12 1417 156/102 mmHg 98.3 F (36.8 C) 95 18 94 %  12/07/12 0700 144/94 mmHg - - - -  12/07/12 0659 - 97.5 F (36.4 C) 92 18 96 %     Recent laboratory studies:  Recent Labs  12/05/12 2137 12/07/12 0900  WBC 19.9* 14.8*  HGB 14.9 13.9  HCT 43.2 42.5  PLT 252 223  NA 141  --   K 3.6  --   CL 102  --   CO2 18*  --   BUN 19  --   CREATININE 1.85*  --   GLUCOSE 157*  --   CALCIUM 8.8  --      Discharge Medications:     Medication List    STOP taking these medications       HYDROcodone-acetaminophen 5-500 MG per tablet  Commonly known as:  VICODIN      TAKE these medications       aspirin EC 325 MG tablet  Take 1 tablet (325 mg total) by mouth daily.     doxycycline 50 MG capsule  Commonly known as:  VIBRAMYCIN  Take 2 capsules (100 mg total) by mouth 2 (two) times daily.     oxyCODONE-acetaminophen 5-325 MG per tablet  Commonly known as:  ROXICET  Take 1-2 tablets by mouth every 6 (six) hours as needed for pain.  Diagnostic Studies: Dg Hip Complete Left  12/05/2012   *RADIOLOGY REPORT*  Clinical Data: Status post fall from ladder; left leg pain.  LEFT HIP - COMPLETE 2+ VIEW  Comparison: None.  Findings: There is no evidence of fracture or dislocation.  Both femoral heads are seated normally within their respective acetabula.  The proximal left femur appears intact.  No significant degenerative change is appreciated.  The sacroiliac joints are unremarkable in appearance.  The visualized bowel gas pattern is grossly unremarkable in appearance.  IMPRESSION: No evidence of fracture or dislocation.   Original Report Authenticated By: Tonia Ghent, M.D.   Dg Tibia/fibula Left  12/05/2012   *RADIOLOGY REPORT*  Clinical Data: Status post fall from ladder; diffuse left lower leg pain and abrasions.  LEFT TIBIA AND FIBULA - 2 VIEW  Comparison: None.  Findings: There is no evidence of fracture or dislocation.  The  tibia and fibula appear intact.  The ankle mortise is incompletely assessed, but appears grossly unremarkable.  The knee joint is incompletely assessed, though no focal abnormalities are seen at the knee joint.  No significant soft tissue abnormalities are characterized on radiograph.  IMPRESSION: No evidence of fracture or dislocation.   Original Report Authenticated By: Tonia Ghent, M.D.   Dg Knee Complete 4 Views Left  12/05/2012   *RADIOLOGY REPORT*  Clinical Data: Status post fall from ladder; left the pain and swelling.  LEFT KNEE - COMPLETE 4+ VIEW  Comparison: None.  Findings: There is no evidence of fracture or dislocation.  The joint spaces are preserved.  No significant degenerative change is seen; the patellofemoral joint is grossly unremarkable in appearance.  A fabella is noted.  No significant joint effusion is seen.  The visualized soft tissues are normal in appearance.  IMPRESSION: No evidence of fracture or dislocation.   Original Report Authenticated By: Tonia Ghent, M.D.   Ct Extrem Lower W Cm Bil  12/06/2012   *RADIOLOGY REPORT*  Clinical Data:  Left lower leg swelling and pain after fall from ladder.  Assess for soft tissue injury.  CT BILATERAL LOWER EXTREMITY WITH CONTRAST  Technique:  Multidetector CT imaging of both lower legs was performed according to the standard protocol following intravenous contrast administration. Multiplanar CT image reconstructions were also generated.  Contrast: OMNIPAQUE IOHEXOL 300 MG/ML  SOLN  Comparison:  Left tibia/fibula radiographs performed 12/05/2012  Findings:  There is no evidence of fracture or dislocation.  The anterolateral musculature of the left lower leg is somewhat more hypodense than that of the right lower leg, and the internal fat planes are somewhat obscured.  The anterior compartment appears slightly larger than on the right side.  This raises suspicion for compartment syndrome, though compartment syndrome is generally  diagnosed on clinical findings.  There is asymmetrically decreased enhancement of the left anterior tibial artery.  The ankle mortise is intact bilaterally.  The visualized portions of both feet are symmetric and unremarkable.  There is a small right knee joint effusion; trace left knee joint fluid remains within normal limits.  The menisci are not well assessed on CT, but no definite focal abnormality is seen.  The medial collateral ligament appears grossly intact bilaterally.  The lateral collateral ligament complexes are within normal limits. The anterior and posterior cruciate ligaments appear intact bilaterally.  The quadriceps and patellar tendons remain intact bilaterally.  IMPRESSION:  1.  Decreased attenuation and increased prominence of the musculature within the anterior compartment of the left lower leg, with partial obscuration  of fat planes and decreased enhancement of the left anterior tibial artery.  This raises suspicion for compartment syndrome, though compartment syndrome is generally diagnosed on clinical findings. 2.  No evidence of fracture or dislocation. 3.  Small right knee joint effusion noted.  These results were called by telephone on 12/06/2012 at 02:17 a.m. to Dr. Marisa Severin, who verbally acknowledged these results.   Original Report Authenticated By: Tonia Ghent, M.D.    Disposition: 01-Home or Self Care      Discharge Orders   Future Orders Complete By Expires   Call MD / Call 911  As directed    Comments:     If you experience chest pain or shortness of breath, CALL 911 and be transported to the hospital emergency room.  If you develope a fever above 101 F, pus (white drainage) or increased drainage or redness at the wound, or calf pain, call your surgeon's office.   Constipation Prevention  As directed    Comments:     Drink plenty of fluids.  Prune juice may be helpful.  You may use a stool softener, such as Colace (over the counter) 100 mg twice a day.  Use MiraLax  (over the counter) for constipation as needed.   Diet - low sodium heart healthy  As directed    Discharge instructions  As directed    Comments:     You may put full weight on your left leg. Ice and elevation throughout the day for the next week. Call [phone] for follow-up in one week.\ Take an aspirin daily to help prevent blood clots. Leave your current dressing in place for the next 2-3 days, then change with new dressing. You can get your leg and incision wet in the shower in 4 days.   Discharge patient  As directed    Increase activity slowly as tolerated  As directed       Follow-up Information   Follow up with Kathryne Hitch, MD. Schedule an appointment as soon as possible for a visit in 1 week.   Specialty:  Orthopedic Surgery   Contact information:   [address] [place] [address] [phone]        Signed: Kathryne Hitch 12/08/2012, 6:57 AM

## 2012-12-08 NOTE — Progress Notes (Signed)
   CARE MANAGEMENT NOTE 12/08/2012  Patient:  ELOISE, MULA   Account Number:  [id number]  Date Initiated:  12/07/2012  Documentation initiated by:  [facility]  Subjective/Objective Assessment:   Two Compartment Fasciotomy (Left), fall from ladder     Action/Plan:   Anticipated DC Date:  12/08/2012   Anticipated DC Plan:  HOME W HOME HEALTH SERVICES      DC Planning Services  CM consult      Choice offered to / List presented to:     DME arranged  Levan Hurst      DME agency  [facility]        Status of service:  Completed, signed off Medicare Important Message given?   (If response is "NO", the following Medicare IM given date fields will be blank) Date Medicare IM given:   Date Additional Medicare IM given:    Discharge Disposition:  HOME/SELF CARE  Per UR Regulation:    If discussed at Long Length of Stay Meetings, dates discussed:    Comments:  12/08/2012 1000 NCM spoke to pt and states he will reside with his sister temp. Contacted [facility] rep for RW. Provided pt with contact info for [facility] to call and arrange appt for post dc follow up with PCP. Pt has appt with Dr. Magnus Ivan in one week.  Isidoro Donning RN CCM Case Mgmt phone [phone]

## 2012-12-10 ENCOUNTER — Encounter (HOSPITAL_COMMUNITY): Payer: Self-pay | Admitting: Orthopaedic Surgery

## 2013-07-05 ENCOUNTER — Emergency Department (HOSPITAL_COMMUNITY)
Admission: EM | Admit: 2013-07-05 | Discharge: 2013-07-05 | Disposition: A | Discharge status: Home / Self Care | Payer: Self-pay | Attending: Emergency Medicine | Admitting: Emergency Medicine

## 2013-07-05 ENCOUNTER — Emergency Department (HOSPITAL_COMMUNITY): Payer: Self-pay

## 2013-07-05 ENCOUNTER — Encounter (HOSPITAL_COMMUNITY): Payer: Self-pay | Admitting: Emergency Medicine

## 2013-07-05 DIAGNOSIS — Z792 Long term (current) use of antibiotics: Secondary | ICD-10-CM | POA: Insufficient documentation

## 2013-07-05 DIAGNOSIS — R000 Tachycardia, unspecified: Secondary | ICD-10-CM | POA: Insufficient documentation

## 2013-07-05 DIAGNOSIS — M25579 Pain in unspecified ankle and joints of unspecified foot: Secondary | ICD-10-CM | POA: Insufficient documentation

## 2013-07-05 DIAGNOSIS — M25476 Effusion, unspecified foot: Secondary | ICD-10-CM | POA: Insufficient documentation

## 2013-07-05 DIAGNOSIS — I10 Essential (primary) hypertension: Secondary | ICD-10-CM | POA: Insufficient documentation

## 2013-07-05 DIAGNOSIS — M25473 Effusion, unspecified ankle: Secondary | ICD-10-CM | POA: Insufficient documentation

## 2013-07-05 DIAGNOSIS — Z7982 Long term (current) use of aspirin: Secondary | ICD-10-CM | POA: Insufficient documentation

## 2013-07-05 DIAGNOSIS — M25572 Pain in left ankle and joints of left foot: Secondary | ICD-10-CM

## 2013-07-05 DIAGNOSIS — E669 Obesity, unspecified: Secondary | ICD-10-CM | POA: Insufficient documentation

## 2013-07-05 HISTORY — DX: Traumatic compartment syndrome of left lower extremity, initial encounter: T79.A22A

## 2013-07-05 MED ORDER — HYDROCODONE-ACETAMINOPHEN 5-325 MG PO TABS: 1.0000 | ORAL_TABLET | ORAL | Status: DC | PRN

## 2013-07-05 MED ORDER — MELOXICAM 7.5 MG PO TABS: 7.5000 mg | ORAL_TABLET | Freq: Every day | ORAL | Status: DC

## 2013-07-05 NOTE — ED Notes (Signed)
Pt states that he started having pain to left ankle after going to the grocery store last night, denies any injury, is ambulatory with use of a walker, cms intact distal

## 2013-07-05 NOTE — ED Provider Notes (Signed)
CSN: [id number]     Arrival date & time 07/05/13  1501 History  This chart was scribed for [facility]. Janit Bern, NP, working with Maudry Diego, MD, by Marcha Dutton ED Scribe. This patient was seen in room APFT20/APFT20 and the patient's care was started at 3:29 PM.    Chief Complaint  Patient presents with  . Ankle Pain    The history is provided by the patient. No language interpreter was used.    HPI Comments: Cody Huynh is a 48 y.o. male with a h/o compartment syndrome of the left lower extremity who presents to the Emergency Department complaining of left ankle pain that began last night. Pt states that he noticed the pain last night after going to the grocery store. Pt denies injury to left ankle. Pt is ambulatory with a walker. He states with pressure and weight bearing he feels pain to the medial aspect of the left ankle. He also reports a "numbing" sensation in his left ankle. Pt denies h/o of plantar fasciitis. Pt states his orthopedist is Dr. Ninfa Linden. PMH significant for compartment syndrome of the left calf.   Past Medical History  Diagnosis Date  . Hypertension   . Compartment syndrome of left lower extremity     Past Surgical History  Procedure Laterality Date  . I&d extremity Left 12/06/2012    Procedure: Two Compartment Fasciotomy;  Surgeon: Mcarthur Rossetti, MD;  Location: [facility];  Service: Orthopedics;  Laterality: Left;    No family history on file. History  Substance Use Topics  . Smoking status: Never Smoker   . Smokeless tobacco: Not on file  . Alcohol Use: Yes    Review of Systems  All other systems reviewed and are negative. left ankle pain  Allergies  Review of patient's allergies indicates no known allergies.   Home Medications   Prior to Admission medications   Medication Sig Start Date End Date Taking? Authorizing Provider  aspirin EC 325 MG tablet Take 1 tablet (325 mg total) by mouth daily. 12/08/12   Mcarthur Rossetti, MD   doxycycline (VIBRAMYCIN) 50 MG capsule Take 2 capsules (100 mg total) by mouth 2 (two) times daily. 12/08/12   Mcarthur Rossetti, MD  oxyCODONE-acetaminophen (ROXICET) 5-325 MG per tablet Take 1-2 tablets by mouth every 6 (six) hours as needed for pain. 12/08/12   Mcarthur Rossetti, MD    Triage Vitals: BP 157/118  Pulse 117  Temp(Src) 98.3 F (36.8 C) (Oral)  Resp 20  Ht 5\' 5"  (1.651 m)  Wt 225 lb (102.059 kg)  BMI 37.44 kg/m2  SpO2 95%   Physical Exam  Nursing note and vitals reviewed. Constitutional: He is oriented to person, place, and time. No distress.  obese  HENT:  Head: Normocephalic and atraumatic.  Eyes: Conjunctivae and EOM are normal.  Neck: Neck supple. No tracheal deviation present.  Cardiovascular: Intact distal pulses.  Tachycardia present.   Pulses:      Dorsalis pedis pulses are 2+ on the right side.  Pulmonary/Chest: Effort normal.  Musculoskeletal:       Left ankle: He exhibits swelling (medial aspect). He exhibits no ecchymosis, no laceration and normal pulse. Tenderness. Medial malleolus tenderness found. Achilles tendon normal.  Neurological: He is alert and oriented to person, place, and time.  Skin: Skin is warm and dry.  Psychiatric: He has a normal mood and affect.    ED Course  Procedures (including critical care time)   DIAGNOSTIC STUDIES: Oxygen  Saturation is 95% on RA, adequate by my interpretation.     COORDINATION OF CARE: 3:35 PM- Pt advised of plan for treatment and pt agrees.  MDM  47 y.o. male with left ankle pain. Stable for discharge without neurovascular deficits. ASO applied, ice elevation and continue using walker. Follow up with Dr. Ninfa Linden, (ortho). Return for worsening symptoms. Discussed with the patient and all questioned fully answered.   Medication List    TAKE these medications       HYDROcodone-acetaminophen 5-325 MG per tablet  Commonly known as:  NORCO/VICODIN  Take 1 tablet by mouth every 4  (four) hours as needed.     meloxicam 7.5 MG tablet  Commonly known as:  MOBIC  Take 1 tablet (7.5 mg total) by mouth daily.      ASK your doctor about these medications       aspirin EC 325 MG tablet  Take 1 tablet (325 mg total) by mouth daily.     doxycycline 50 MG capsule  Commonly known as:  VIBRAMYCIN  Take 2 capsules (100 mg total) by mouth 2 (two) times daily.     oxyCODONE-acetaminophen 5-325 MG per tablet  Commonly known as:  ROXICET  Take 1-2 tablets by mouth every 6 (six) hours as needed for pain.        I personally performed the services described in this documentation, which was scribed in my presence. The recorded information has been reviewed and is accurate.   Ashley Murrain, [place] 07/05/13 1626

## 2013-07-05 NOTE — Discharge Instructions (Signed)
Follow up with with orthopedic doctor. Do not take the narcotic if you are driving as it will make you sleepy.

## 2013-07-05 NOTE — ED Provider Notes (Signed)
Medical screening examination/treatment/procedure(s) were performed by non-physician practitioner and as supervising physician I was immediately available for consultation/collaboration.   EKG Interpretation None        Oliviagrace Crisanti L Casie Sturgeon, MD 07/05/13 2132 

## 2015-09-06 IMAGING — CR DG ANKLE COMPLETE 3+V*L*
3 series · 3 of 3 positions shown · non-contrast
Comparison: None.

CLINICAL DATA: ANKLE PAIN

EXAM:
LEFT ANKLE COMPLETE - 3+ VIEW

[view not recorded (1 of 3)]
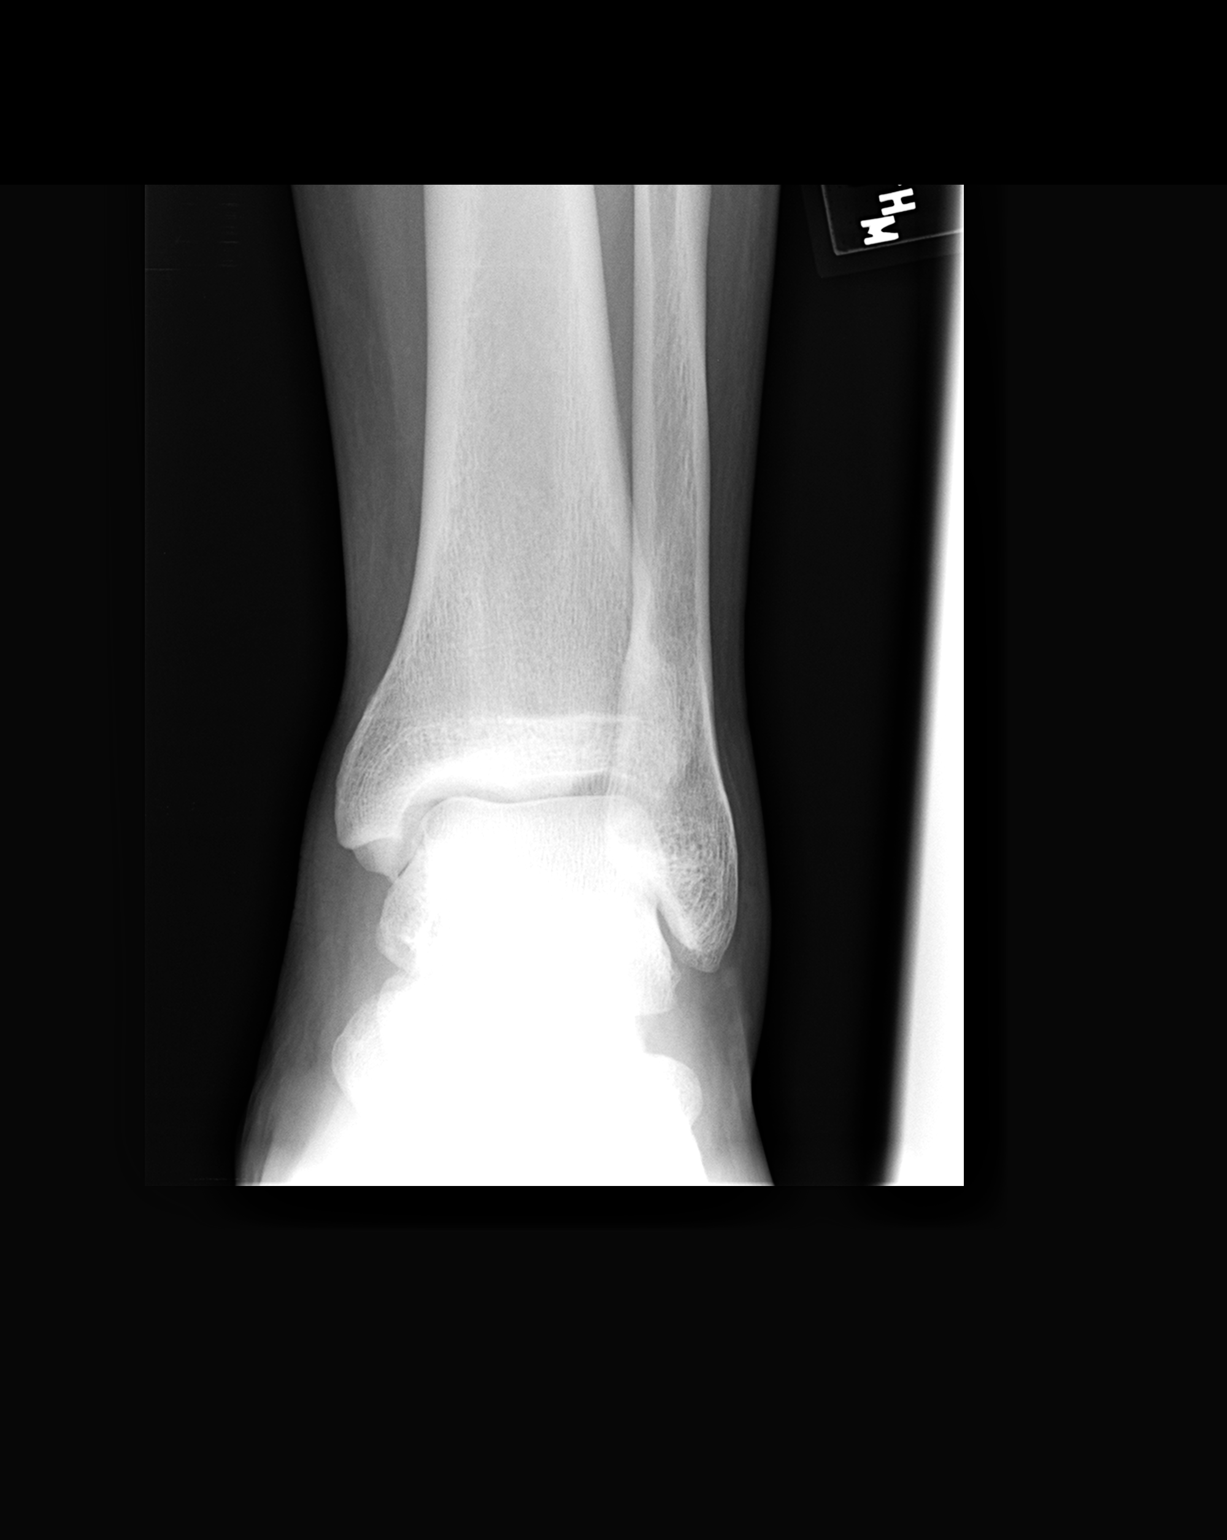

[view not recorded (2 of 3)]
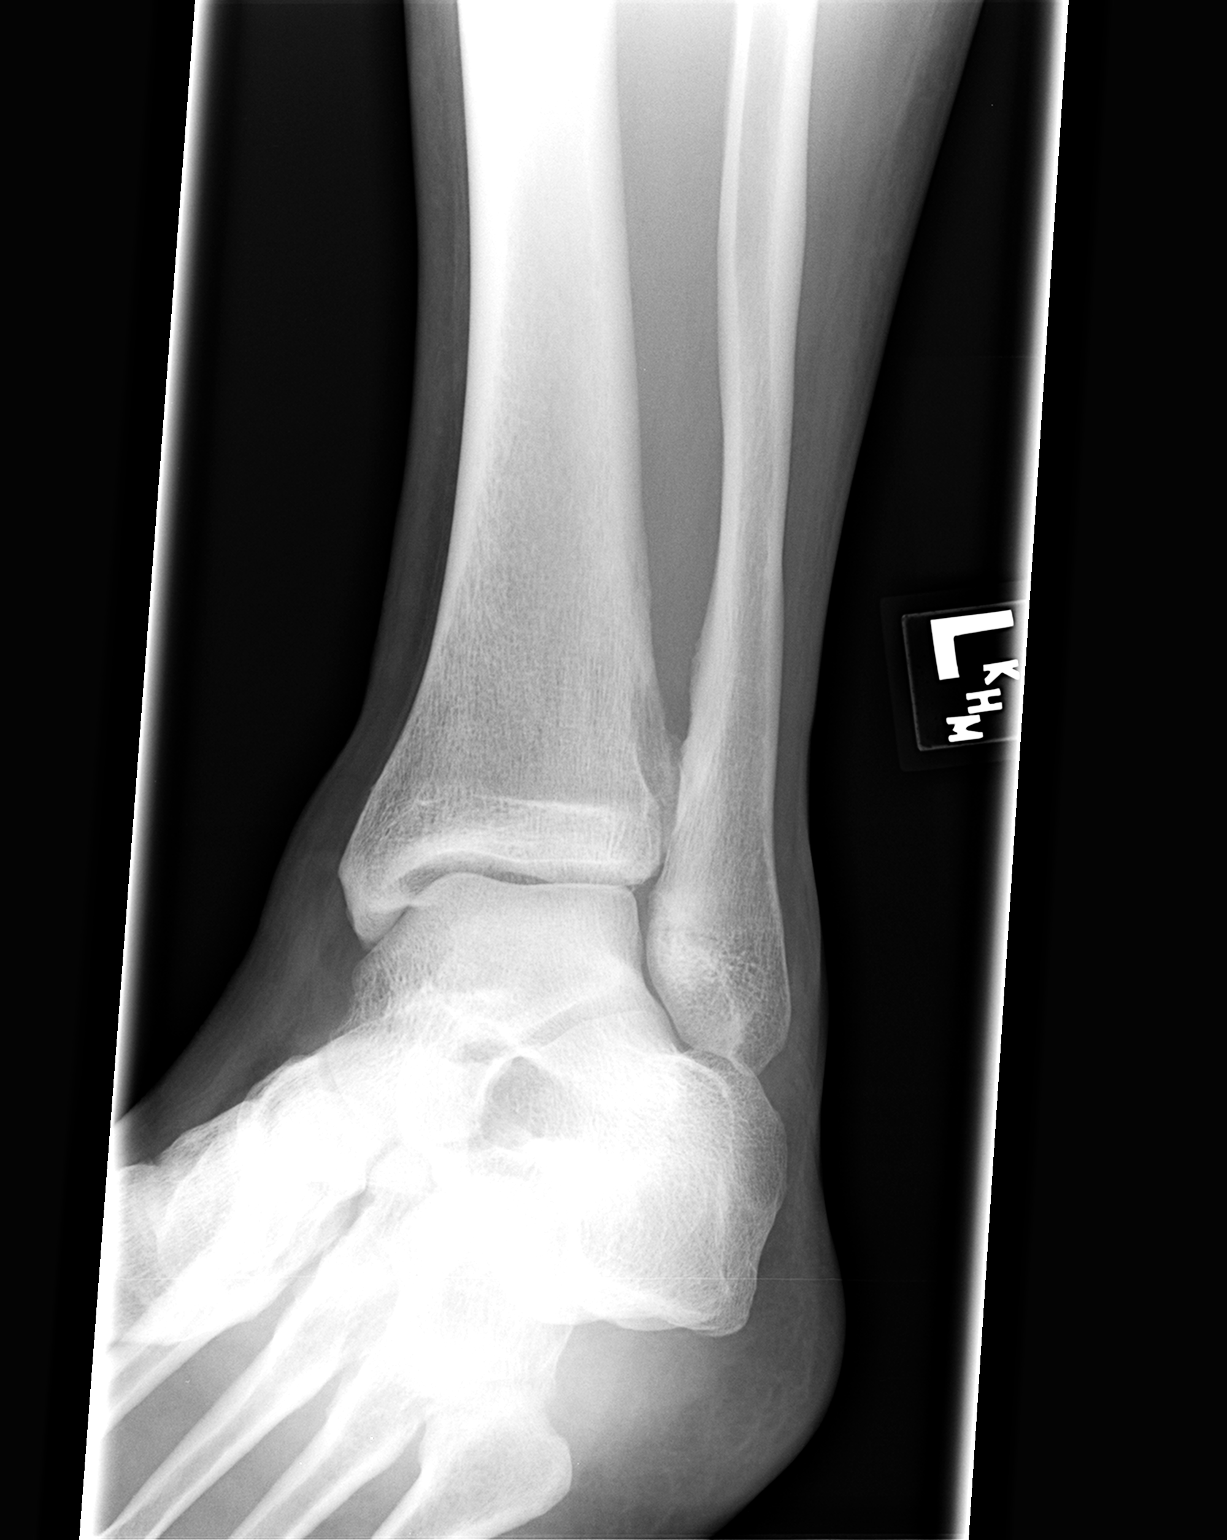

[view not recorded (3 of 3)]
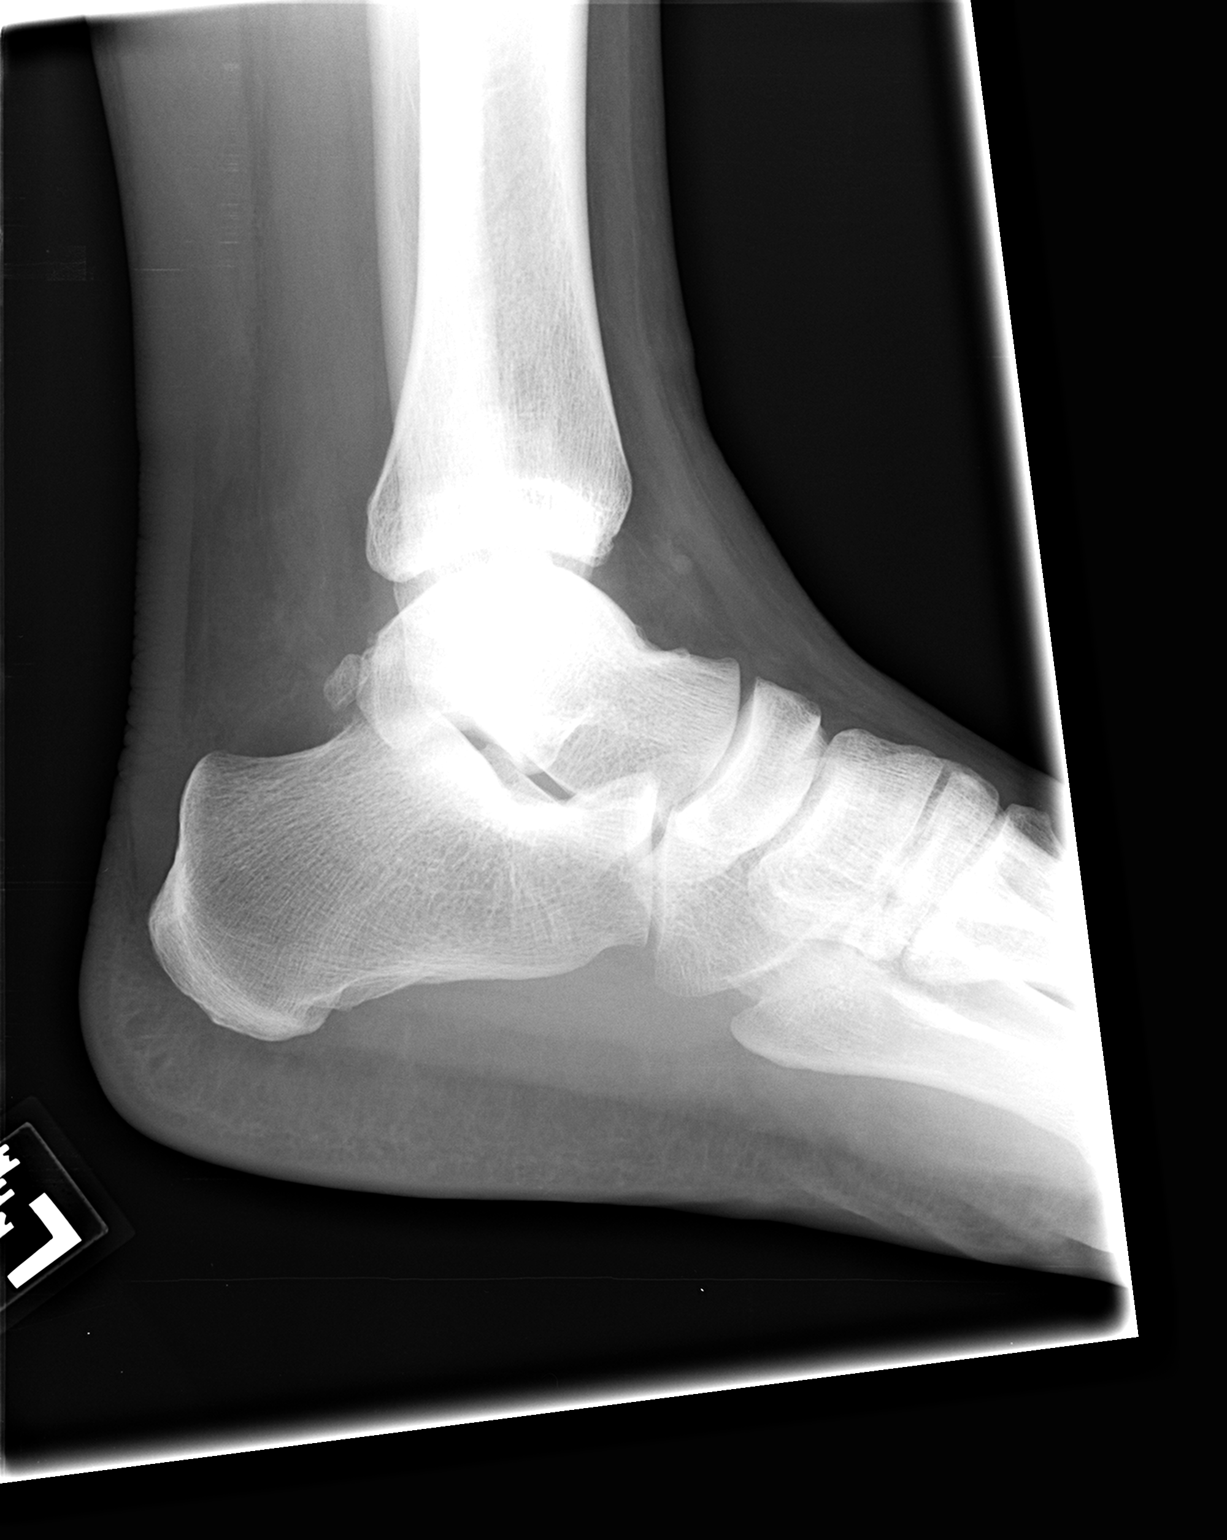

[3 of 3 positions shown; findings below may reference images not displayed]

FINDINGS: There is no evidence of fracture, dislocation, or joint effusion.
There is no evidence of arthropathy or other focal bone abnormality.
Soft tissues are unremarkable.
IMPRESSION: Negative.

## 2016-08-07 ENCOUNTER — Emergency Department (HOSPITAL_COMMUNITY)
Admission: EM | Admit: 2016-08-07 | Discharge: 2016-08-07 | Disposition: A | Discharge status: Home / Self Care | Payer: BLUE CROSS/BLUE SHIELD | Attending: Emergency Medicine | Admitting: Emergency Medicine

## 2016-08-07 ENCOUNTER — Encounter (HOSPITAL_COMMUNITY): Payer: Self-pay

## 2016-08-07 DIAGNOSIS — L237 Allergic contact dermatitis due to plants, except food: Secondary | ICD-10-CM | POA: Insufficient documentation

## 2016-08-07 DIAGNOSIS — I10 Essential (primary) hypertension: Secondary | ICD-10-CM | POA: Diagnosis not present

## 2016-08-07 DIAGNOSIS — R21 Rash and other nonspecific skin eruption: Secondary | ICD-10-CM | POA: Diagnosis present

## 2016-08-07 DIAGNOSIS — Z79899 Other long term (current) drug therapy: Secondary | ICD-10-CM | POA: Diagnosis not present

## 2016-08-07 DIAGNOSIS — Z7982 Long term (current) use of aspirin: Secondary | ICD-10-CM | POA: Insufficient documentation

## 2016-08-07 MED ORDER — DEXAMETHASONE 4 MG PO TABS: 4.0000 mg | ORAL_TABLET | Freq: Two times a day (BID) | ORAL | 0 refills | Status: DC

## 2016-08-07 MED ORDER — FLUCONAZOLE 150 MG PO TABS: 150.0000 mg | ORAL_TABLET | Freq: Every day | ORAL | 0 refills | Status: DC

## 2016-08-07 MED ORDER — HYDROXYZINE PAMOATE 25 MG PO CAPS: 25.0000 mg | ORAL_CAPSULE | Freq: Three times a day (TID) | ORAL | 0 refills | Status: DC | PRN

## 2016-08-07 MED ORDER — DEXAMETHASONE SODIUM PHOSPHATE 4 MG/ML IJ SOLN
8.0000 mg | Freq: Once | INTRAMUSCULAR | Status: AC
  Administered 2016-08-07: 8 mg via INTRAMUSCULAR
  Filled 2016-08-07: qty 2

## 2016-08-07 MED ORDER — FLUCONAZOLE 100 MG PO TABS
200.0000 mg | ORAL_TABLET | Freq: Once | ORAL | Status: DC
Start: 2016-08-07 — End: 2016-08-07

## 2016-08-07 MED ORDER — HYDROXYZINE HCL 25 MG PO TABS: 25.0000 mg | ORAL_TABLET | Freq: Once | ORAL | Status: DC

## 2016-08-07 NOTE — ED Triage Notes (Signed)
Was cutting down a tree this past weekend and now I have a rash, must be poison oak per pt.

## 2016-08-07 NOTE — ED Provider Notes (Signed)
[facility] DEPT Provider Note   CSN: [id number] Arrival date & time: 08/07/16  2031     History   Chief Complaint Chief Complaint  Patient presents with  . Rash    HPI Cody Huynh is a 51 y.o. male.  Patient is a 51 year old male who presents to the emergency department with complaint of a rash.  The patient states that he was cutting down a tree on Saturday June 9. The patient states that shortly after that he began to notice rash consistent with poison ivy/poison oak on his arms and legs. He now has it on the side of his face and also on his neck. He has tried over-the-counter poison ivy medications, but these have not been successful. He states the itching is becoming more and more intense. He requests assistance with this problem.      Past Medical History:  Diagnosis Date  . Compartment syndrome of left lower extremity ([place])   . Hypertension     Patient Active Problem List   Diagnosis Date Noted  . Compartment syndrome of left lower extremity ([place]) 12/06/2012    Past Surgical History:  Procedure Laterality Date  . I&D EXTREMITY Left 12/06/2012   Procedure: Two Compartment Fasciotomy;  Surgeon: Mcarthur Rossetti, MD;  Location: [facility];  Service: Orthopedics;  Laterality: Left;       Home Medications    Prior to Admission medications   Medication Sig Start Date End Date Taking? Authorizing Provider  lisinopril-hydrochlorothiazide (PRINZIDE,ZESTORETIC) 20-25 MG tablet Take 1 tablet by mouth daily.   Yes [provider]  aspirin EC 325 MG tablet Take 1 tablet (325 mg total) by mouth daily. 12/08/12   Mcarthur Rossetti, MD  doxycycline (VIBRAMYCIN) 50 MG capsule Take 2 capsules (100 mg total) by mouth 2 (two) times daily. 12/08/12   Mcarthur Rossetti, MD  HYDROcodone-acetaminophen (NORCO/VICODIN) 5-325 MG per tablet Take 1 tablet by mouth every 4 (four) hours as needed. 07/05/13   Ashley Murrain, NP  meloxicam (MOBIC) 7.5 MG  tablet Take 1 tablet (7.5 mg total) by mouth daily. 07/05/13   Ashley Murrain, NP  oxyCODONE-acetaminophen (ROXICET) 5-325 MG per tablet Take 1-2 tablets by mouth every 6 (six) hours as needed for pain. 12/08/12   Mcarthur Rossetti, MD    Family History History reviewed. No pertinent family history.  Social History Social History  Substance Use Topics  . Smoking status: Never Smoker  . Smokeless tobacco: Never Used  . Alcohol use Yes     Allergies   Patient has no known allergies.   Review of Systems Review of Systems  Constitutional: Negative for activity change.       All ROS Neg except as noted in HPI  HENT: Negative for nosebleeds.   Eyes: Negative for photophobia and discharge.  Respiratory: Negative for cough, shortness of breath and wheezing.   Cardiovascular: Negative for chest pain and palpitations.  Gastrointestinal: Negative for abdominal pain and blood in stool.  Genitourinary: Negative for dysuria, frequency and hematuria.  Musculoskeletal: Negative for arthralgias, back pain and neck pain.  Skin: Positive for rash.  Neurological: Negative for dizziness, seizures and speech difficulty.  Psychiatric/Behavioral: Negative for confusion and hallucinations.     Physical Exam Updated Vital Signs BP (!) 160/104 (BP Location: Right Arm)   Pulse 80   Temp 98.7 F (37.1 C) (Oral)   Resp 16   Ht 5\' 5"  (1.651 m)   Wt 102.1 kg (225 lb)  SpO2 95%   BMI 37.44 kg/m   Physical Exam  Constitutional: He is oriented to person, place, and time. He appears well-developed and well-nourished.  Non-toxic appearance.  HENT:  Head: Normocephalic.  Right Ear: Tympanic membrane and external ear normal.  Left Ear: Tympanic membrane and external ear normal.  Eyes: EOM and lids are normal. Pupils are equal, round, and reactive to light.  Neck: Normal range of motion. Neck supple. Carotid bruit is not present.  Cardiovascular: Normal rate, regular rhythm, normal heart  sounds, intact distal pulses and normal pulses.   Pulmonary/Chest: Breath sounds normal. No respiratory distress.  Abdominal: Soft. Bowel sounds are normal. There is no tenderness. There is no guarding.  Musculoskeletal: Normal range of motion.  Lymphadenopathy:       Head (right side): No submandibular adenopathy present.       Head (left side): No submandibular adenopathy present.    He has no cervical adenopathy.  Neurological: He is alert and oriented to person, place, and time. He has normal strength. No cranial nerve deficit or sensory deficit.  Skin: Skin is warm and dry. Rash noted.  Patient is a red macular papular rash on the arms, legs, and face on. A few of them have small blisters in the center on. There no red streaks appreciated.  Psychiatric: He has a normal mood and affect. His speech is normal.  Nursing note and vitals reviewed.    ED Treatments / Results  Labs (all labs ordered are listed, but only abnormal results are displayed) Labs Reviewed - No data to display  EKG  EKG Interpretation None       Radiology No results found.  Procedures Procedures (including critical care time)  Medications Ordered in ED Medications  dexamethasone (DECADRON) injection 8 mg (not administered)     Initial Impression / Assessment and Plan / ED Course  I have reviewed the triage vital signs and the nursing notes.  Pertinent labs & imaging results that were available during my care of the patient were reviewed by me and considered in my medical decision making (see chart for details).      Final Clinical Impressions(s) / ED Diagnoses MDM Patient has a rash consistent with poison ivy poison oak. Patient treated in the emergency department with intramuscular steroids. Prescription for Vistaril will be given for the patient use at bedtime. The patient will use Allegra during the day. `   Final diagnoses:  Allergic contact dermatitis due to plants, except food     New Prescriptions Discharge Medication List as of 08/07/2016 10:21 PM    START taking these medications   Details  dexamethasone (DECADRON) 4 MG tablet Take 1 tablet (4 mg total) by mouth 2 (two) times daily with a meal., Starting Tue 08/07/2016, Print    hydrOXYzine (VISTARIL) 25 MG capsule Take 1 capsule (25 mg total) by mouth 3 (three) times daily as needed., Starting Tue 08/07/2016, Print         Marshell Levan, [place], PA-C 08/08/16 1013    Milton Ferguson, MD 08/08/16 1606

## 2016-08-07 NOTE — Discharge Instructions (Signed)
Please wash hands frequently. Please change your Charna Archer daily until this has resolved. Use Decadron 2 times daily with food. Use Allegra/over-the-counter during the day for itching. Use Vistaril at bedtime or in the evening.This medication may cause drowsiness. Please do not drink, drive, or participate in activity that requires concentration while taking this medication. See Dr. Legrand Rams for additional evaluation and management if not improving.

## 2016-09-25 DIAGNOSIS — M25511 Pain in right shoulder: Secondary | ICD-10-CM | POA: Diagnosis not present

## 2016-09-25 DIAGNOSIS — I10 Essential (primary) hypertension: Secondary | ICD-10-CM | POA: Diagnosis not present

## 2016-09-25 DIAGNOSIS — E669 Obesity, unspecified: Secondary | ICD-10-CM | POA: Diagnosis not present

## 2017-03-29 DIAGNOSIS — I10 Essential (primary) hypertension: Secondary | ICD-10-CM | POA: Diagnosis not present

## 2017-03-29 DIAGNOSIS — N289 Disorder of kidney and ureter, unspecified: Secondary | ICD-10-CM | POA: Diagnosis not present

## 2017-03-29 DIAGNOSIS — M25511 Pain in right shoulder: Secondary | ICD-10-CM | POA: Diagnosis not present

## 2017-04-17 DIAGNOSIS — M19011 Primary osteoarthritis, right shoulder: Secondary | ICD-10-CM | POA: Diagnosis not present

## 2017-04-17 DIAGNOSIS — M7541 Impingement syndrome of right shoulder: Secondary | ICD-10-CM | POA: Diagnosis not present

## 2017-04-17 DIAGNOSIS — M25511 Pain in right shoulder: Secondary | ICD-10-CM | POA: Diagnosis not present

## 2017-05-20 DIAGNOSIS — M19011 Primary osteoarthritis, right shoulder: Secondary | ICD-10-CM | POA: Diagnosis not present

## 2017-05-20 DIAGNOSIS — M7541 Impingement syndrome of right shoulder: Secondary | ICD-10-CM | POA: Diagnosis not present

## 2017-05-29 DIAGNOSIS — M25511 Pain in right shoulder: Secondary | ICD-10-CM | POA: Diagnosis not present

## 2017-06-12 DIAGNOSIS — M75121 Complete rotator cuff tear or rupture of right shoulder, not specified as traumatic: Secondary | ICD-10-CM | POA: Diagnosis not present

## 2018-03-12 DIAGNOSIS — I10 Essential (primary) hypertension: Secondary | ICD-10-CM | POA: Diagnosis not present

## 2018-03-13 ENCOUNTER — Other Ambulatory Visit: Payer: Self-pay

## 2018-03-13 ENCOUNTER — Emergency Department (HOSPITAL_COMMUNITY)
Admission: EM | Admit: 2018-03-13 | Discharge: 2018-03-13 | Disposition: A | Discharge status: Home / Self Care | Payer: BLUE CROSS/BLUE SHIELD | Attending: Emergency Medicine | Admitting: Emergency Medicine

## 2018-03-13 ENCOUNTER — Encounter (HOSPITAL_COMMUNITY): Payer: Self-pay | Admitting: Emergency Medicine

## 2018-03-13 DIAGNOSIS — I10 Essential (primary) hypertension: Secondary | ICD-10-CM | POA: Diagnosis not present

## 2018-03-13 DIAGNOSIS — R300 Dysuria: Secondary | ICD-10-CM | POA: Diagnosis not present

## 2018-03-13 DIAGNOSIS — Z79899 Other long term (current) drug therapy: Secondary | ICD-10-CM | POA: Insufficient documentation

## 2018-03-13 DIAGNOSIS — R369 Urethral discharge, unspecified: Secondary | ICD-10-CM | POA: Diagnosis not present

## 2018-03-13 HISTORY — DX: Disorder of kidney and ureter, unspecified: N28.9

## 2018-03-13 LAB — URINALYSIS, ROUTINE W REFLEX MICROSCOPIC
BILIRUBIN URINE: NEGATIVE
Glucose, UA: NEGATIVE mg/dL
Ketones, ur: NEGATIVE mg/dL
NITRITE: NEGATIVE
PH: 5 (ref 5.0–8.0)
Protein, ur: NEGATIVE mg/dL
SPECIFIC GRAVITY, URINE: 1.019 (ref 1.005–1.030)
WBC, UA: >50 WBC/hpf — ABNORMAL HIGH (ref 0–5)

## 2018-03-13 MED ORDER — LIDOCAINE HCL (PF) 1 % IJ SOLN
INTRAMUSCULAR | Status: AC
  Administered 2018-03-13: 2 mL
  Filled 2018-03-13: qty 2

## 2018-03-13 MED ORDER — CEFTRIAXONE SODIUM 250 MG IJ SOLR
250.0000 mg | Freq: Once | INTRAMUSCULAR | Status: AC
  Administered 2018-03-13: 250 mg via INTRAMUSCULAR
  Filled 2018-03-13: qty 250

## 2018-03-13 MED ORDER — AZITHROMYCIN 250 MG PO TABS
1000.0000 mg | ORAL_TABLET | Freq: Once | ORAL | Status: AC
Start: 2018-03-13 — End: 2018-03-13
  Administered 2018-03-13: 1000 mg via ORAL
  Filled 2018-03-13: qty 4

## 2018-03-13 NOTE — ED Provider Notes (Signed)
[facility] EMERGENCY DEPARTMENT Provider Note   CSN: [id number] Arrival date & time: 03/13/18  2143     History   Chief Complaint Chief Complaint  Patient presents with  . Urinary Frequency    HPI Cody Huynh is a 53 y.o. male who presents to the ED with c/o dysuria and discharge from penis. The symptoms started one week ago and have gotten worse. Patient reports unprotected sex with a new partner prior to the symptoms starting. He denies fever, chills or back pain.   HPI  Past Medical History:  Diagnosis Date  . 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 Hypertension   . Renal disorder     Patient Active Problem List   Diagnosis Date Noted  . 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 COMPARTMENT SYNDROME MEASUREMENT Left    leg  . I&D EXTREMITY Left 12/06/2012   Procedure: Two Compartment Fasciotomy;  Surgeon: Mcarthur Rossetti, MD;  Location: [place];  Service: Orthopedics;  Laterality: Left;        Home Medications    Prior to Admission medications   Medication Sig Start Date End Date Taking? Authorizing Provider  lisinopril-hydrochlorothiazide (PRINZIDE,ZESTORETIC) 20-25 MG tablet Take 1 tablet by mouth daily.   Yes [provider]    Family History History reviewed. No pertinent family history.  Social History Social History   Tobacco Use  . Smoking status: Never Smoker  . Smokeless tobacco: Never Used  Substance Use Topics  . Alcohol use: Yes    Comment: evry other day  . Drug use: No     Allergies   Patient has no known allergies.   Review of Systems Review of Systems  Genitourinary: Positive for discharge and dysuria. Negative for penile swelling, scrotal swelling and testicular pain.  All other systems reviewed and are negative.    Physical Exam Updated Vital Signs BP (!) 139/99 (BP Location: Right Arm)   Pulse 78   Temp  98 F (36.7 C) (Oral)   Resp 19   SpO2 98%   Physical Exam Vitals signs and nursing note reviewed. Exam conducted with a chaperone present.  Constitutional:      General: He is not in acute distress.    Appearance: He is well-developed.  HENT:     Head: Normocephalic.     Mouth/Throat:     Mouth: Mucous membranes are moist.  Neck:     Musculoskeletal: Neck supple.  Cardiovascular:     Rate and Rhythm: Normal rate.  Pulmonary:     Effort: Pulmonary effort is normal.  Abdominal:     Palpations: Abdomen is soft.     Tenderness: There is no abdominal tenderness.  Genitourinary:    Penis: Circumcised. Discharge present. No erythema, tenderness, swelling or lesions.      Scrotum/Testes: Normal.  Musculoskeletal: Normal range of motion.  Lymphadenopathy:     Lower Body: No right inguinal adenopathy. No left inguinal adenopathy.  Skin:    General: Skin is warm and dry.  Neurological:     Mental Status: He is alert and oriented to person, place, and time.      ED Treatments / Results  Labs (all labs ordered are listed, but only abnormal results are displayed) Labs Reviewed  URINALYSIS, ROUTINE W REFLEX MICROSCOPIC - Abnormal; Notable for the following components:      Result Value   Hgb urine dipstick SMALL (*)  Leukocytes, UA MODERATE (*)    WBC, UA >50 (*)    Bacteria, UA RARE (*)    All other components within normal limits  URINE CULTURE  GC/CHLAMYDIA PROBE AMP ([place]) NOT AT [facility]   Radiology No results found.  Procedures Procedures (including critical care time)  Medications Ordered in ED Medications  cefTRIAXone (ROCEPHIN) injection 250 mg (250 mg Intramuscular Given 03/13/18 2309)  azithromycin (ZITHROMAX) tablet 1,000 mg (1,000 mg Oral Given 03/13/18 2310)  lidocaine (PF) (XYLOCAINE) 1 % injection (2 mLs  Given 03/13/18 2309)     Initial Impression / Assessment and Plan / ED Course  I have reviewed the triage vital signs and the nursing notes. Pt  presents with dysuria and concerns for possible STD.  Pt understands that they have GC/Chlamydia cultures pending and that they will need to inform all sexual partners if results return positive. Pt has been treated prophylactically with azithromycin and Rocephin due to pts history and exam. Patient to be discharged with instructions to follow up with with his PCP. Discussed importance of using protection when sexually active.   Final Clinical Impressions(s) / ED Diagnoses   Final diagnoses:  Dysuria  Penile discharge    ED Discharge Orders    None       Debroah Baller [place], [place] 03/13/18 2317    Milton Ferguson, MD 03/14/18 2328

## 2018-03-13 NOTE — ED Triage Notes (Addendum)
Pt c/o tan penile d/c, burning with urination x 1 week. Denies any flank/back pain. Denies scrotal swelling/pain

## 2018-03-13 NOTE — Discharge Instructions (Addendum)
If the cultures are positive someone will call you. Follow up with your primary care doctor. Return here as needed.

## 2018-03-15 LAB — URINE CULTURE: Culture: NO GROWTH

## 2018-03-17 LAB — GC/CHLAMYDIA PROBE AMP (~~LOC~~) NOT AT ARMC
Chlamydia: NEGATIVE
Neisseria Gonorrhea: POSITIVE — AB

## 2018-07-29 ENCOUNTER — Encounter: Payer: Self-pay | Admitting: Nurse Practitioner

## 2018-07-29 DIAGNOSIS — I10 Essential (primary) hypertension: Secondary | ICD-10-CM | POA: Diagnosis not present

## 2018-07-29 DIAGNOSIS — Z6839 Body mass index (BMI) 39.0-39.9, adult: Secondary | ICD-10-CM | POA: Diagnosis not present

## 2018-07-29 DIAGNOSIS — K625 Hemorrhage of anus and rectum: Secondary | ICD-10-CM | POA: Diagnosis not present

## 2018-09-02 DIAGNOSIS — I10 Essential (primary) hypertension: Secondary | ICD-10-CM | POA: Diagnosis not present

## 2018-09-02 DIAGNOSIS — Z0001 Encounter for general adult medical examination with abnormal findings: Secondary | ICD-10-CM | POA: Diagnosis not present

## 2018-09-02 DIAGNOSIS — N182 Chronic kidney disease, stage 2 (mild): Secondary | ICD-10-CM | POA: Diagnosis not present

## 2018-09-02 DIAGNOSIS — Z6839 Body mass index (BMI) 39.0-39.9, adult: Secondary | ICD-10-CM | POA: Diagnosis not present

## 2018-09-03 ENCOUNTER — Encounter (INDEPENDENT_AMBULATORY_CARE_PROVIDER_SITE_OTHER): Payer: Self-pay | Admitting: *Deleted

## 2018-09-30 ENCOUNTER — Encounter (INDEPENDENT_AMBULATORY_CARE_PROVIDER_SITE_OTHER): Payer: Self-pay | Admitting: *Deleted

## 2018-09-30 ENCOUNTER — Ambulatory Visit (INDEPENDENT_AMBULATORY_CARE_PROVIDER_SITE_OTHER): Payer: BLUE CROSS/BLUE SHIELD | Admitting: Nurse Practitioner

## 2018-09-30 ENCOUNTER — Other Ambulatory Visit: Payer: Self-pay

## 2018-09-30 ENCOUNTER — Telehealth (INDEPENDENT_AMBULATORY_CARE_PROVIDER_SITE_OTHER): Payer: Self-pay | Admitting: *Deleted

## 2018-09-30 ENCOUNTER — Encounter (INDEPENDENT_AMBULATORY_CARE_PROVIDER_SITE_OTHER): Payer: Self-pay | Admitting: Nurse Practitioner

## 2018-09-30 VITALS — BP 145/105 | HR 99 | Temp 98.3°F | Ht 65.0 in | Wt 233.9 lb

## 2018-09-30 DIAGNOSIS — K648 Other hemorrhoids: Secondary | ICD-10-CM

## 2018-09-30 DIAGNOSIS — K625 Hemorrhage of anus and rectum: Secondary | ICD-10-CM | POA: Diagnosis not present

## 2018-09-30 DIAGNOSIS — N182 Chronic kidney disease, stage 2 (mild): Secondary | ICD-10-CM | POA: Diagnosis not present

## 2018-09-30 DIAGNOSIS — K602 Anal fissure, unspecified: Secondary | ICD-10-CM | POA: Diagnosis not present

## 2018-09-30 DIAGNOSIS — N189 Chronic kidney disease, unspecified: Secondary | ICD-10-CM | POA: Insufficient documentation

## 2018-09-30 NOTE — Telephone Encounter (Signed)
Patient needs suprep 

## 2018-09-30 NOTE — Progress Notes (Signed)
Subjective:    Patient ID: Cody Huynh, male    DOB: 02/16/1966, 53 y.o.   MRN: [id number]  HPI  Cody Huynh is a 53 y.o. male with a past medical history of HTN and CKD stage 2.  He presents today for further evaluation regarding rectal bleeding. He reports seeing red blood on the stool and sometimes sees drops of blood in the toilet water without associated abdominal or rectal pain which has intermittently occurred over the past 2 years. He might see blood on the stool for 2 to 3 days in a week then no noticeable bleeding for 1 1/2 months. No melena. He does not take ASA or NSAIDS. No family history of colorectal cancer.   Labs 07/29/2018: BUN 23. Cr. 1.67. HgA1C 4.9%. Alk phos 55. AST 30. ALT 40. WBC 9.6. Hg 15.3. HCT 46.8. PLT 231  Past Medical History:  Diagnosis Date  . 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 Hypertension   . Renal disorder    Past Surgical History:  Procedure Laterality Date  . COMPARTMENT SYNDROME MEASUREMENT Left    leg  . I&D EXTREMITY Left 12/06/2012   Procedure: Two Compartment Fasciotomy;  Surgeon: Mcarthur Rossetti, MD;  Location: [place];  Service: Orthopedics;  Laterality: Left;    Current Outpatient Medications on File Prior to Visit  Medication Sig Dispense Refill  . amLODipine (NORVASC) 5 MG tablet Take 5 mg by mouth daily.    Marland Kitchen lisinopril-hydrochlorothiazide (PRINZIDE,ZESTORETIC) 20-25 MG tablet Take 1 tablet by mouth daily.     No current facility-administered medications on file prior to visit.    No Known Allergies   Family history: mother died from CHF. Father living at age 45.  Social History   Socioeconomic History  . Marital status: Single    Spouse name: Not on file  . Number of children: Not on file  . Years of education: Not on file  . Highest education level: Not on file  Occupational History  . Not on file  Social Needs  . Financial resource strain: Not on file  . Food insecurity    Worry:  Not on file    Inability: Not on file  . Transportation needs    Medical: Not on file    Non-medical: Not on file  Tobacco Use  . Smoking status: Never Smoker  . Smokeless tobacco: Never Used  Substance and Sexual Activity  . Alcohol use: Yes    Comment: evry other day  . Drug use: No  . Sexual activity: Not on file  Lifestyle  . Physical activity    Days per week: Not on file    Minutes per session: Not on file  . Stress: Not on file  Relationships  . Social Herbalist on phone: Not on file    Gets together: Not on file    Attends religious service: Not on file    Active member of club or organization: Not on file    Attends meetings of clubs or organizations: Not on file    Relationship status: Not on file  . Intimate partner violence    Fear of current or ex partner: Not on file    Emotionally abused: Not on file    Physically abused: Not on file    Forced sexual activity: Not on file  Other Topics Concern  . Not on file  Social History Narrative  . Not on file  Review of Systems  See HPI, all other systems reviewed and are negative     Objective:   Physical Exam  BP (!) 145/105   Pulse 99   Temp 98.3 F (36.8 C)   Ht 5' 5"  (1.651 m)   Wt 233 lb 14.4 oz (106.1 kg)   BMI 38.92 kg/m    General: 53 y.o. male in well developed in NAD Eyes: sclera nonicteric, conjunctiva pink Mouth: dentition intact, no ulcers Neck: no lymphadenopathy or thyromegaly Heart: RRR, no murmurs Lungs: clear throughout  Abdomen: soft, nontender, + BS x 4 quads. Rectal: small nonbleeding posterior fissure, internal hemorrhoids, no blood or stool, limited rectal exam, Chaperone (Reba present for rectal exam). Extremities: no edema. Neuro: alert and oriented x 3, no focal deficits   Assessment & Plan:   1. Rectal bleeding -schedule a colonoscopy, benefits and risks discussed with patient  2. Internal hemorrhoids and posterior fissure -avoid straining, use Benefiber  1 tbsp daily, Desitin for fissure and internal hemorrhoids  3. HTN  4. CKD stage II  Further follow up to be determined after colonoscopy completed

## 2018-09-30 NOTE — Patient Instructions (Signed)
1. Schedule a colonoscopy with Dr. Laural Golden  2. Avoid straining, may start Benefiber 1 tablespoon once daily as needed.  3. Desitin diaper rash ointment, apply a small amount inside the anal opening for anal fissure and internal hemorrhoid irritation and bleeding twice daily as needed  4. Further follow up to be determined after colonoscopy completed  5. Call our office if your rectal bleeding worsens

## 2018-10-02 MED ORDER — SUPREP BOWEL PREP KIT 17.5-3.13-1.6 GM/177ML PO SOLN: 1.0000 | Freq: Once | ORAL | 0 refills | Status: AC

## 2018-11-17 ENCOUNTER — Other Ambulatory Visit (HOSPITAL_COMMUNITY): Payer: BLUE CROSS/BLUE SHIELD

## 2018-11-17 ENCOUNTER — Other Ambulatory Visit (HOSPITAL_COMMUNITY)
Admission: RE | Admit: 2018-11-17 | Discharge: 2018-11-17 | Disposition: A | Discharge status: Home / Self Care | Payer: BLUE CROSS/BLUE SHIELD | Source: Ambulatory Visit | Attending: Internal Medicine | Admitting: Internal Medicine

## 2018-11-17 DIAGNOSIS — Z20828 Contact with and (suspected) exposure to other viral communicable diseases: Secondary | ICD-10-CM | POA: Diagnosis not present

## 2018-11-17 DIAGNOSIS — Z01812 Encounter for preprocedural laboratory examination: Secondary | ICD-10-CM | POA: Diagnosis not present

## 2018-11-17 LAB — SARS CORONAVIRUS 2 (TAT 6-24 HRS): SARS Coronavirus 2: NEGATIVE

## 2018-11-18 ENCOUNTER — Encounter (HOSPITAL_COMMUNITY): Payer: Self-pay | Admitting: Anesthesiology

## 2018-11-19 ENCOUNTER — Other Ambulatory Visit: Payer: Self-pay

## 2018-11-19 ENCOUNTER — Ambulatory Visit (HOSPITAL_COMMUNITY)
Admission: RE | Admit: 2018-11-19 | Discharge: 2018-11-19 | Disposition: A | Discharge status: Home / Self Care | Payer: BLUE CROSS/BLUE SHIELD | Attending: Internal Medicine | Admitting: Internal Medicine

## 2018-11-19 ENCOUNTER — Encounter (HOSPITAL_COMMUNITY): Payer: Self-pay | Admitting: *Deleted

## 2018-11-19 ENCOUNTER — Encounter (HOSPITAL_COMMUNITY)
Admission: RE | Disposition: A | Discharge status: Home / Self Care | Payer: Self-pay | Source: Home / Self Care | Attending: Internal Medicine

## 2018-11-19 DIAGNOSIS — K602 Anal fissure, unspecified: Secondary | ICD-10-CM | POA: Insufficient documentation

## 2018-11-19 DIAGNOSIS — I10 Essential (primary) hypertension: Secondary | ICD-10-CM | POA: Diagnosis not present

## 2018-11-19 DIAGNOSIS — K921 Melena: Secondary | ICD-10-CM | POA: Diagnosis not present

## 2018-11-19 DIAGNOSIS — K635 Polyp of colon: Secondary | ICD-10-CM | POA: Insufficient documentation

## 2018-11-19 DIAGNOSIS — D122 Benign neoplasm of ascending colon: Secondary | ICD-10-CM | POA: Diagnosis not present

## 2018-11-19 DIAGNOSIS — D125 Benign neoplasm of sigmoid colon: Secondary | ICD-10-CM | POA: Diagnosis not present

## 2018-11-19 DIAGNOSIS — K648 Other hemorrhoids: Secondary | ICD-10-CM | POA: Diagnosis not present

## 2018-11-19 DIAGNOSIS — K625 Hemorrhage of anus and rectum: Secondary | ICD-10-CM | POA: Insufficient documentation

## 2018-11-19 HISTORY — PX: POLYPECTOMY: SHX5525

## 2018-11-19 HISTORY — PX: COLONOSCOPY: SHX5424

## 2018-11-19 SURGERY — COLONOSCOPY
Anesthesia: Moderate Sedation

## 2018-11-19 MED ORDER — STERILE WATER FOR IRRIGATION IR SOLN
Status: DC | PRN
  Administered 2018-11-19: 2.5 mL

## 2018-11-19 MED ORDER — MIDAZOLAM HCL 5 MG/5ML IJ SOLN
INTRAMUSCULAR | Status: AC
  Filled 2018-11-19: qty 5

## 2018-11-19 MED ORDER — MIDAZOLAM HCL 5 MG/5ML IJ SOLN
INTRAMUSCULAR | Status: DC | PRN
  Administered 2018-11-19 (×6): 2 mg via INTRAVENOUS

## 2018-11-19 MED ORDER — SODIUM CHLORIDE 0.9 % IV SOLN: INTRAVENOUS | Status: DC

## 2018-11-19 MED ORDER — MEPERIDINE HCL 50 MG/ML IJ SOLN
INTRAMUSCULAR | Status: DC | PRN
  Administered 2018-11-19 (×2): 25 mg via INTRAVENOUS

## 2018-11-19 MED ORDER — MIDAZOLAM HCL 5 MG/5ML IJ SOLN
INTRAMUSCULAR | Status: AC
  Filled 2018-11-19: qty 10

## 2018-11-19 MED ORDER — SODIUM CHLORIDE 0.9 % IV SOLN
INTRAVENOUS | Status: DC
  Administered 2018-11-19: 1000 mL via INTRAVENOUS

## 2018-11-19 MED ORDER — MEPERIDINE HCL 50 MG/ML IJ SOLN
INTRAMUSCULAR | Status: AC
  Filled 2018-11-19: qty 1

## 2018-11-19 NOTE — Discharge Instructions (Signed)
No aspirin or NSAIDs for 24 hours. Resume usual medications and diet as before. No driving for 24 hours. Physician will call with biopsy results.   Colonoscopy, Adult, Care After This sheet gives you information about how to care for yourself after your procedure. Your doctor may also give you more specific instructions. If you have problems or questions, call your doctor. What can I expect after the procedure? After the procedure, it is common to have:  A small amount of blood in your poop for 24 hours.  Some gas.  Mild cramping or bloating in your belly. Follow these instructions at home: General instructions  For the first 24 hours after the procedure: ? Do not drive or use machinery. ? Do not sign important documents. ? Do not drink alcohol. ? Do your daily activities more slowly than normal. ? Eat foods that are soft and easy to digest.  Take over-the-counter or prescription medicines only as told by your doctor. To help cramping and bloating:   Try walking around.  Put heat on your belly (abdomen) as told by your doctor. Use a heat source that your doctor recommends, such as a moist heat pack or a heating pad. ? Put a towel between your skin and the heat source. ? Leave the heat on for 20-30 minutes. ? Remove the heat if your skin turns bright red. This is especially important if you cannot feel pain, heat, or cold. You can get burned. Eating and drinking   Drink enough fluid to keep your pee (urine) clear or pale yellow.  Return to your normal diet as told by your doctor. Avoid heavy or fried foods that are hard to digest.  Avoid drinking alcohol for as long as told by your doctor. Contact a doctor if:  You have blood in your poop (stool) 2-3 days after the procedure. Get help right away if:  You have more than a small amount of blood in your poop.  You see large clumps of tissue (blood clots) in your poop.  Your belly is swollen.  You feel sick to your  stomach (nauseous).  You throw up (vomit).  You have a fever.  You have belly pain that gets worse, and medicine does not help your pain. Summary  After the procedure, it is common to have a small amount of blood in your poop. You may also have mild cramping and bloating in your belly.  For the first 24 hours after the procedure, do not drive or use machinery, do not sign important documents, and do not drink alcohol.  Get help right away if you have a lot of blood in your poop, feel sick to your stomach, have a fever, or have more belly pain. This information is not intended to replace advice given to you by your health care provider. Make sure you discuss any questions you have with your health care provider. Document Released: 03/17/2010 Document Revised: 12/13/2016 Document Reviewed: 11/07/2015 Elsevier Patient Education  2020 [employer].  Hemorrhoids Hemorrhoids are swollen veins in and around the rectum or anus. There are two types of hemorrhoids:  Internal hemorrhoids. These occur in the veins that are just inside the rectum. They may poke through to the outside and become irritated and painful.  External hemorrhoids. These occur in the veins that are outside the anus and can be felt as a painful swelling or hard lump near the anus. Most hemorrhoids do not cause serious problems, and they can be managed with home  treatments such as diet and lifestyle changes. If home treatments do not help the symptoms, procedures can be done to shrink or remove the hemorrhoids. What are the causes? This condition is caused by increased pressure in the anal area. This pressure may result from various things, including:  Constipation.  Straining to have a bowel movement.  Diarrhea.  Pregnancy.  Obesity.  Sitting for long periods of time.  Heavy lifting or other activity that causes you to strain.  Anal sex.  Riding a bike for a long period of time. What are the signs or  symptoms? Symptoms of this condition include:  Pain.  Anal itching or irritation.  Rectal bleeding.  Leakage of stool (feces).  Anal swelling.  One or more lumps around the anus. How is this diagnosed? This condition can often be diagnosed through a visual exam. Other exams or tests may also be done, such as:  An exam that involves feeling the rectal area with a gloved hand (digital rectal exam).  An exam of the anal canal that is done using a small tube (anoscope).  A blood test, if you have lost a significant amount of blood.  A test to look inside the colon using a flexible tube with a camera on the end (sigmoidoscopy or colonoscopy). How is this treated? This condition can usually be treated at home. However, various procedures may be done if dietary changes, lifestyle changes, and other home treatments do not help your symptoms. These procedures can help make the hemorrhoids smaller or remove them completely. Some of these procedures involve surgery, and others do not. Common procedures include:  Rubber band ligation. Rubber bands are placed at the base of the hemorrhoids to cut off their blood supply.  Sclerotherapy. Medicine is injected into the hemorrhoids to shrink them.  Infrared coagulation. A type of light energy is used to get rid of the hemorrhoids.  Hemorrhoidectomy surgery. The hemorrhoids are surgically removed, and the veins that supply them are tied off.  Stapled hemorrhoidopexy surgery. The surgeon staples the base of the hemorrhoid to the rectal wall. Follow these instructions at home: Eating and drinking   Eat foods that have a lot of fiber in them, such as whole grains, beans, nuts, fruits, and vegetables.  Ask your health care provider about taking products that have added fiber (fiber supplements).  Reduce the amount of fat in your diet. You can do this by eating low-fat dairy products, eating less red meat, and avoiding processed foods.  Drink  enough fluid to keep your urine pale yellow. Managing pain and swelling   Take warm sitz baths for 20 minutes, 3-4 times a day to ease pain and discomfort. You may do this in a bathtub or using a portable sitz bath that fits over the toilet.  If directed, apply ice to the affected area. Using ice packs between sitz baths may be helpful. ? Put ice in a plastic bag. ? Place a towel between your skin and the bag. ? Leave the ice on for 20 minutes, 2-3 times a day. General instructions  Take over-the-counter and prescription medicines only as told by your health care provider.  Use medicated creams or suppositories as told.  Get regular exercise. Ask your health care provider how much and what kind of exercise is best for you. In general, you should do moderate exercise for at least 30 minutes on most days of the week (150 minutes each week). This can include activities such as walking, biking,  or yoga.  Go to the bathroom when you have the urge to have a bowel movement. Do not wait.  Avoid straining to have bowel movements.  Keep the anal area dry and clean. Use wet toilet paper or moist towelettes after a bowel movement.  Do not sit on the toilet for long periods of time. This increases blood pooling and pain.  Keep all follow-up visits as told by your health care provider. This is important. Contact a health care provider if you have:  Increasing pain and swelling that are not controlled by treatment or medicine.  Difficulty having a bowel movement, or you are unable to have a bowel movement.  Pain or inflammation outside the area of the hemorrhoids. Get help right away if you have:  Uncontrolled bleeding from your rectum. Summary  Hemorrhoids are swollen veins in and around the rectum or anus.  Most hemorrhoids can be managed with home treatments such as diet and lifestyle changes.  Taking warm sitz baths can help ease pain and discomfort.  In severe cases, procedures or  surgery can be done to shrink or remove the hemorrhoids. This information is not intended to replace advice given to you by your health care provider. Make sure you discuss any questions you have with your health care provider. Document Released: 02/10/2000 Document Revised: 02/20/2018 Document Reviewed: 07/04/2017 Elsevier Patient Education  [place].  Colon Polyps  Polyps are tissue growths inside the body. Polyps can grow in many places, including the large intestine (colon). A polyp may be a round bump or a mushroom-shaped growth. You could have one polyp or several. Most colon polyps are noncancerous (benign). However, some colon polyps can become cancerous over time. Finding and removing the polyps early can help prevent this. What are the causes? The exact cause of colon polyps is not known. What increases the risk? You are more likely to develop this condition if you:  Have a family history of colon cancer or colon polyps.  Are older than 56 or older than 45 if you are African American.  Have inflammatory bowel disease, such as ulcerative colitis or Crohn's disease.  Have certain hereditary conditions, such as: ? Familial adenomatous polyposis. ? Lynch syndrome. ? Turcot syndrome. ? Peutz-Jeghers syndrome.  Are overweight.  Smoke cigarettes.  Do not get enough exercise.  Drink too much alcohol.  Eat a diet that is high in fat and red meat and low in fiber.  Had childhood cancer that was treated with abdominal radiation. What are the signs or symptoms? Most polyps do not cause symptoms. If you have symptoms, they may include:  Blood coming from your rectum when having a bowel movement.  Blood in your stool. The stool may look dark red or black.  Abdominal pain.  A change in bowel habits, such as constipation or diarrhea. How is this diagnosed? This condition is diagnosed with a colonoscopy. This is a procedure in which a lighted, flexible scope is  inserted into the anus and then passed into the colon to examine the area. Polyps are sometimes found when a colonoscopy is done as part of routine cancer screening tests. How is this treated? Treatment for this condition involves removing any polyps that are found. Most polyps can be removed during a colonoscopy. Those polyps will then be tested for cancer. Additional treatment may be needed depending on the results of testing. Follow these instructions at home: Lifestyle  Maintain a healthy weight, or lose weight if recommended by  your health care provider.  Exercise every day or as told by your health care provider.  Do not use any products that contain nicotine or tobacco, such as cigarettes and e-cigarettes. If you need help quitting, ask your health care provider.  If you drink alcohol, limit how much you have: ? 0-1 drink a day for women. ? 0-2 drinks a day for men.  Be aware of how much alcohol is in your drink. In the [place] one drink equals one 12 oz bottle of beer (355 mL), one 5 oz glass of wine (148 mL), or one 1 oz shot of hard liquor (44 mL). Eating and drinking   Eat foods that are high in fiber, such as fruits, vegetables, and whole grains.  Eat foods that are high in calcium and vitamin D, such as milk, cheese, yogurt, eggs, liver, fish, and broccoli.  Limit foods that are high in fat, such as fried foods and desserts.  Limit the amount of red meat and processed meat you eat, such as hot dogs, sausage, bacon, and lunch meats. General instructions  Keep all follow-up visits as told by your health care provider. This is important. ? This includes having regularly scheduled colonoscopies. ? Talk to your health care provider about when you need a colonoscopy. Contact a health care provider if:  You have new or worsening bleeding during a bowel movement.  You have new or increased blood in your stool.  You have a change in bowel habits.  You lose weight for no  known reason. Summary  Polyps are tissue growths inside the body. Polyps can grow in many places, including the colon.  Most colon polyps are noncancerous (benign), but some can become cancerous over time.  This condition is diagnosed with a colonoscopy.  Treatment for this condition involves removing any polyps that are found. Most polyps can be removed during a colonoscopy. This information is not intended to replace advice given to you by your health care provider. Make sure you discuss any questions you have with your health care provider. Document Released: 11/09/2003 Document Revised: 05/30/2017 Document Reviewed: 05/30/2017 Elsevier Patient Education  2020 [employer].

## 2018-11-19 NOTE — H&P (Signed)
Cody Huynh is an 53 y.o. male.   Chief Complaint: Patient is here for colonoscopy. HPI: Patient is 53 year old Afro-American male who presents with few year history of intermittent hematochezia.  Bleeding always occurs with bowel movements.  He denies diarrhea constipation abdominal pain.  Bleeding usually is small amount but at times he may see few drops of blood in the toilet.  He has good appetite and his weight has been stable.  He does not take aspirin or other OTC NSAIDs. Family history is negative for CRC.  Past Medical History:  Diagnosis Date  . 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 Hypertension   . Renal disorder     Past Surgical History:  Procedure Laterality Date  . COMPARTMENT SYNDROME MEASUREMENT Left    leg  . I&D EXTREMITY Left 12/06/2012   Procedure: Two Compartment Fasciotomy;  Surgeon: Mcarthur Rossetti, MD;  Location: [place];  Service: Orthopedics;  Laterality: Left;    History reviewed. No pertinent family history. Social History:  reports that he has never smoked. He has never used smokeless tobacco. He reports current alcohol use. He reports that he does not use drugs.  Allergies: No Known Allergies  Medications Prior to Admission  Medication Sig Dispense Refill  . amLODipine (NORVASC) 5 MG tablet Take 5 mg by mouth daily at 3 pm.     . lisinopril-hydrochlorothiazide (PRINZIDE,ZESTORETIC) 20-25 MG tablet Take 1 tablet by mouth daily.      No results found for this or any previous visit (from the past 48 hour(s)). No results found.  ROS  Blood pressure (!) 161/105, pulse 85, temperature 98.3 F (36.8 C), temperature source Oral, resp. rate 16, height 5\' 5"  (1.651 m), weight 105.7 kg, SpO2 96 %. Physical Exam  Constitutional: He appears well-developed and well-nourished.  HENT:  Mouth/Throat: Oropharynx is clear and moist.  Eyes: Conjunctivae are normal.  Neck: No thyromegaly present.  Cardiovascular: Normal rate, regular  rhythm and normal heart sounds.  No murmur heard. Respiratory: Effort normal and breath sounds normal.  GI:  Abdomen is full.  He has small umbilical hernia which is completely reducible.  Abdomen is soft and nontender with organomegaly or masses.  Musculoskeletal:        General: No edema.  Lymphadenopathy:    He has no cervical adenopathy.  Neurological: He is alert.  Skin: Skin is warm and dry.     Assessment/Plan Hematochezia Diagnostic colonoscopy.  Hildred Laser, MD 11/19/2018, 7:29 AM

## 2018-11-19 NOTE — Op Note (Signed)
[facility] Patient Name: Cody Huynh Procedure Date: 11/19/2018 7:11 AM MRN: [id number] Date of Birth: 06/01/1965 Attending MD: Hildred Laser , MD CSN: [id number] Age: 53 Admit Type: Outpatient Procedure:                Colonoscopy Indications:              Hematochezia Providers:                Hildred Laser, MD, Jeanann Lewandowsky. Sharon Seller, RN, Raphael Gibney, Technician Referring MD:             Rosita Fire, MD Medicines:                Meperidine 50 mg IV, Midazolam 12 mg IV Complications:            No immediate complications. Estimated Blood Loss:     Estimated blood loss was minimal. Procedure:                Pre-Anesthesia Assessment:                           - Prior to the procedure, a History and Physical                            was performed, and patient medications and                            allergies were reviewed. The patient's tolerance of                            previous anesthesia was also reviewed. The risks                            and benefits of the procedure and the sedation                            options and risks were discussed with the patient.                            All questions were answered, and informed consent                            was obtained. Prior Anticoagulants: The patient has                            taken no previous anticoagulant or antiplatelet                            agents. ASA Grade Assessment: II - A patient with                            mild systemic disease. After reviewing the risks  and benefits, the patient was deemed in                            satisfactory condition to undergo the procedure.                           After obtaining informed consent, the colonoscope                            was passed under direct vision. Throughout the                            procedure, the patient's blood pressure, pulse, and   oxygen saturations were monitored continuously. The                            [id number]-[id number] scope was introduced through                            the anus and advanced to the the cecum, identified                            by appendiceal orifice and ileocecal valve. The                            colonoscopy was performed without difficulty. The                            patient tolerated the procedure well. The quality                            of the bowel preparation was adequate to identify                            polyps. The ileocecal valve, appendiceal orifice,                            and rectum were photographed. Scope In: 7:40:58 AM Scope Out: 8:09:40 AM Scope Withdrawal Time: 0 hours 25 minutes 4 seconds  Total Procedure Duration: 0 hours 28 minutes 42 seconds  Findings:      The perianal and digital rectal examinations were normal.      A small polyp was found in the proximal ascending colon. The polyp was       pedunculated. Biopsies were taken with a cold forceps for histology. The       pathology specimen was placed into Bottle Number 1.      A 5 mm polyp was found in the mid sigmoid colon. The polyp was removed       with a cold snare. Resection and retrieval were complete. The pathology       specimen was placed into Bottle Number 2.      Internal hemorrhoids were found during retroflexion. The hemorrhoids       were small. Impression:               - One small polyp in the  proximal ascending colon.                            Biopsied.                           - One 5 mm polyp in the mid sigmoid colon, removed                            with a cold snare. Resected and retrieved.                           - Internal hemorrhoids. Moderate Sedation:      Moderate (conscious) sedation was administered by the endoscopy nurse       and supervised by the endoscopist. The following parameters were       monitored: oxygen saturation, heart rate, blood  pressure, CO2       capnography and response to care. Total physician intraservice time was       36 minutes. Recommendation:           - Patient has a contact number available for                            emergencies. The signs and symptoms of potential                            delayed complications were discussed with the                            patient. Return to normal activities tomorrow.                            Written discharge instructions were provided to the                            patient.                           - Resume previous diet today.                           - Continue present medications.                           - No aspirin, ibuprofen, naproxen, or other                            non-steroidal anti-inflammatory drugs for 1 day.                           - Await pathology results.                           - Repeat colonoscopy is recommended. The                            colonoscopy date  will be determined after pathology                            results from today's exam become available for                            review. Procedure Code(s):        --- Professional ---                           [phone], Colonoscopy, flexible; with removal of                            tumor(s), polyp(s), or other lesion(s) by snare                            technique                           45380, 59, Colonoscopy, flexible; with biopsy,                            single or multiple                           99153, Moderate sedation; each additional 15                            minutes intraservice time                           G0500, Moderate sedation services provided by the                            same physician or other qualified health care                            professional performing a gastrointestinal                            endoscopic service that sedation supports,                            requiring the presence of an independent  trained                            observer to assist in the monitoring of the                            patient's level of consciousness and physiological                            status; initial 15 minutes of intra-service time;                            patient age 54 years or older (additional time 80  be reported with [phone], as appropriate) Diagnosis Code(s):        --- Professional ---                           K63.5, Polyp of colon                           K64.8, Other hemorrhoids                           K92.1, Melena (includes Hematochezia) CPT copyright 2019 [employer]. All rights reserved. The codes documented in this report are preliminary and upon coder review may  be revised to meet current compliance requirements. Hildred Laser, MD Hildred Laser, MD 11/19/2018 8:18:48 AM This report has been signed electronically. Number of Addenda: 0

## 2018-11-20 LAB — SURGICAL PATHOLOGY

## 2018-11-24 ENCOUNTER — Encounter (HOSPITAL_COMMUNITY): Payer: Self-pay | Admitting: Internal Medicine

## 2019-02-09 DIAGNOSIS — Z6839 Body mass index (BMI) 39.0-39.9, adult: Secondary | ICD-10-CM | POA: Diagnosis not present

## 2019-02-09 DIAGNOSIS — N182 Chronic kidney disease, stage 2 (mild): Secondary | ICD-10-CM | POA: Diagnosis not present

## 2019-02-09 DIAGNOSIS — I10 Essential (primary) hypertension: Secondary | ICD-10-CM | POA: Diagnosis not present

## 2019-05-07 DIAGNOSIS — Z23 Encounter for immunization: Secondary | ICD-10-CM | POA: Diagnosis not present

## 2019-05-11 DIAGNOSIS — N182 Chronic kidney disease, stage 2 (mild): Secondary | ICD-10-CM | POA: Diagnosis not present

## 2019-05-11 DIAGNOSIS — I10 Essential (primary) hypertension: Secondary | ICD-10-CM | POA: Diagnosis not present

## 2019-05-11 DIAGNOSIS — Z6838 Body mass index (BMI) 38.0-38.9, adult: Secondary | ICD-10-CM | POA: Diagnosis not present

## 2019-06-06 DIAGNOSIS — Z23 Encounter for immunization: Secondary | ICD-10-CM | POA: Diagnosis not present

## 2019-06-10 DIAGNOSIS — L255 Unspecified contact dermatitis due to plants, except food: Secondary | ICD-10-CM | POA: Diagnosis not present

## 2019-06-10 DIAGNOSIS — R21 Rash and other nonspecific skin eruption: Secondary | ICD-10-CM | POA: Diagnosis not present

## 2019-11-04 DIAGNOSIS — I10 Essential (primary) hypertension: Secondary | ICD-10-CM | POA: Diagnosis not present

## 2019-11-04 DIAGNOSIS — N182 Chronic kidney disease, stage 2 (mild): Secondary | ICD-10-CM | POA: Diagnosis not present

## 2019-11-04 DIAGNOSIS — Z0001 Encounter for general adult medical examination with abnormal findings: Secondary | ICD-10-CM | POA: Diagnosis not present

## 2019-11-19 ENCOUNTER — Other Ambulatory Visit: Payer: BLUE CROSS/BLUE SHIELD

## 2019-11-19 ENCOUNTER — Other Ambulatory Visit: Payer: Self-pay

## 2019-11-19 DIAGNOSIS — Z20822 Contact with and (suspected) exposure to covid-19: Secondary | ICD-10-CM

## 2019-11-21 LAB — NOVEL CORONAVIRUS, NAA: SARS-CoV-2, NAA: NOT DETECTED

## 2019-11-21 LAB — SARS-COV-2, NAA 2 DAY TAT

## 2019-12-31 ENCOUNTER — Ambulatory Visit: Payer: Self-pay | Attending: Internal Medicine

## 2019-12-31 DIAGNOSIS — Z23 Encounter for immunization: Secondary | ICD-10-CM

## 2019-12-31 NOTE — Progress Notes (Signed)
   Covid-19 Vaccination Clinic  Name:  THEODUS RAN    MRN: [id number] DOB: 09/25/1965  12/31/2019  Mr. Rhew was observed post Covid-19 immunization for 15 minutes without incident. He was provided with Vaccine Information Sheet and instruction to access the V-Safe system.   Mr. Lippman was instructed to call 911 with any severe reactions post vaccine: Marland Kitchen Difficulty breathing  . Swelling of face and throat  . A fast heartbeat  . A bad rash all over body  . Dizziness and weakness

## 2022-01-06 ENCOUNTER — Other Ambulatory Visit: Payer: Self-pay

## 2022-01-06 ENCOUNTER — Emergency Department (HOSPITAL_COMMUNITY)
Admission: EM | Admit: 2022-01-06 | Discharge: 2022-01-06 | Disposition: A | Discharge status: Home / Self Care | Payer: Managed Care, Other (non HMO) | Attending: Emergency Medicine | Admitting: Emergency Medicine

## 2022-01-06 ENCOUNTER — Encounter (HOSPITAL_COMMUNITY): Payer: Self-pay

## 2022-01-06 DIAGNOSIS — M79671 Pain in right foot: Secondary | ICD-10-CM | POA: Diagnosis present

## 2022-01-06 DIAGNOSIS — M79674 Pain in right toe(s): Secondary | ICD-10-CM | POA: Diagnosis not present

## 2022-01-06 DIAGNOSIS — Z79899 Other long term (current) drug therapy: Secondary | ICD-10-CM | POA: Diagnosis not present

## 2022-01-06 DIAGNOSIS — I129 Hypertensive chronic kidney disease with stage 1 through stage 4 chronic kidney disease, or unspecified chronic kidney disease: Secondary | ICD-10-CM | POA: Diagnosis not present

## 2022-01-06 DIAGNOSIS — N189 Chronic kidney disease, unspecified: Secondary | ICD-10-CM | POA: Insufficient documentation

## 2022-01-06 DIAGNOSIS — M25571 Pain in right ankle and joints of right foot: Secondary | ICD-10-CM

## 2022-01-06 MED ORDER — KETOROLAC TROMETHAMINE 60 MG/2ML IM SOLN
30.0000 mg | Freq: Once | INTRAMUSCULAR | Status: AC
  Administered 2022-01-06: 30 mg via INTRAMUSCULAR
  Filled 2022-01-06: qty 2

## 2022-01-06 MED ORDER — DEXAMETHASONE SODIUM PHOSPHATE 10 MG/ML IJ SOLN
8.0000 mg | Freq: Once | INTRAMUSCULAR | Status: AC
  Administered 2022-01-06: 8 mg via INTRAMUSCULAR
  Filled 2022-01-06: qty 1

## 2022-01-06 NOTE — ED Triage Notes (Signed)
Patient prsent to ED via pov, c/o right ankle and foot  pain on Thursday.  Reports pain continues to get worse on movement. Patient states he is unable to walk on right foot. Denies injury to foot.

## 2022-01-06 NOTE — ED Provider Notes (Signed)
[facility] EMERGENCY DEPARTMENT Provider Note   CSN: [id number] Arrival date & time: 01/06/22  1051     History  Chief Complaint  Patient presents with   Ankle Pain   Foot Pain    SEVEN DOLLENS is a 56 y.o. male.  With history of hypertension and chronic kidney disease who presents ED for evaluation of right great toe and heel pain.  Patient states his pain started on Thursday and has progressively gotten worse.  States it makes it difficult to walk and he does not like to place pressure on the foot so he slides his foot instead.  States this happened to him approximately 6 months ago with pain in the same areas, but was significantly worse at that time.  States he met urgent care and was treated with a steroid injection which significantly improved his symptoms.  Pain is present with dorsiflexion and weightbearing and he rates it at a 9 out of 10.  Patient does not have pain at rest.  No preceding injury.  Patient does have a diet consisting of a lot of red meat and liquor.  Denies numbness, tingling, weakness, fevers, chills   Ankle Pain Foot Pain       Home Medications Prior to Admission medications   Medication Sig Start Date End Date Taking? Authorizing Provider  amLODipine (NORVASC) 5 MG tablet Take 5 mg by mouth daily at 3 pm.     [provider]  lisinopril-hydrochlorothiazide (PRINZIDE,ZESTORETIC) 20-25 MG tablet Take 1 tablet by mouth daily.    [provider]      Allergies    Patient has no known allergies.    Review of Systems   Review of Systems  Musculoskeletal:  Positive for arthralgias and myalgias.  All other systems reviewed and are negative.   Physical Exam Updated Vital Signs BP 119/82 (BP Location: Right Arm)   Pulse 90   Temp 98.6 F (37 C) (Oral)   Resp 16   SpO2 94%  Physical Exam Vitals and nursing note reviewed.  Constitutional:      General: He is not in acute distress.    Appearance: Normal appearance. He is  normal weight. He is not ill-appearing.  HENT:     Head: Normocephalic and atraumatic.  Cardiovascular:     Pulses:          Dorsalis pedis pulses are 2+ on the right side and 2+ on the left side.  Pulmonary:     Effort: Pulmonary effort is normal. No respiratory distress.  Abdominal:     General: Abdomen is flat.  Musculoskeletal:        General: Normal range of motion.     Cervical back: Neck supple.     Right ankle:     Right Achilles Tendon: Tenderness present. Thompson's test negative.     Left ankle: Normal.     Left Achilles Tendon: Normal.     Comments: Following a range of motion.  Neurovascular status intact.  Tenderness to palpation along the Achilles.  No swelling or erythema.  Feet:     Right foot:     Skin integrity: Skin integrity normal.     Left foot:     Skin integrity: Skin integrity normal.  Skin:    General: Skin is warm and dry.     Capillary Refill: Capillary refill takes less than 2 seconds.     Findings: No bruising, erythema or rash.  Neurological:     General: No  focal deficit present.     Mental Status: He is alert and oriented to person, place, and time.  Psychiatric:        Mood and Affect: Mood normal.        Behavior: Behavior normal.     ED Results / Procedures / Treatments   Labs (all labs ordered are listed, but only abnormal results are displayed) Labs Reviewed - No data to display  EKG None  Radiology No results found.  Procedures Procedures    Medications Ordered in ED Medications - No data to display  ED Course/ Medical Decision Making/ A&P                           Medical Decision Making Risk Prescription drug management.  This patient presents to the ED for concern of right heel and great toe pain, this involves an extensive number of treatment options, and is a complaint that carries with it a high risk of complications and morbidity.  The differential diagnosis includes gout, Achilles tendinopathy, strain,  sprain   Co morbidities that complicate the patient evaluation   CKD   Additional history obtained from: Nursing notes from this visit.  Afebrile, hemodynamically stable.  Patient is a 56 year old male who presents the ED for evaluation of right heel and great toe pain.  Patient states he has had similar symptoms in the past and was treated successfully with a steroid injection.  Physical exam remarkable for mild tenderness to palpation along the right Achilles, otherwise unremarkable.  No inciting injury.  I have low suspicion for traumatic injury or infection at this time.  Patient will be treated with IM Decadron and Toradol for gout versus tendinitis picture.  Patient was offered a walking boot and crutches, but declined.  Patient was strongly encouraged to follow-up with his primary care provider within a week for reassessment.  Stable at discharge.  At this time there does not appear to be any evidence of an acute emergency medical condition and the patient appears stable for discharge with appropriate outpatient follow up. Diagnosis was discussed with patient who verbalizes understanding of care plan and is agreeable to discharge. I have discussed return precautions with patient who verbalizes understanding. Patient encouraged to follow-up with their PCP within 1 week. All questions answered.  Patient's case discussed with Dr. Langston Masker who agrees with plan to discharge with follow-up.   Note: Portions of this report may have been transcribed using voice recognition software. Every effort was made to ensure accuracy; however, inadvertent computerized transcription errors may still be present.          Final Clinical Impression(s) / ED Diagnoses Final diagnoses:  None    Rx / DC Orders ED Discharge Orders     None         Roylene Reason, Hershal Coria 01/06/22 1255    Wyvonnia Dusky, MD 01/06/22 1511

## 2022-01-06 NOTE — Discharge Instructions (Addendum)
You have been seen today for your complaint of right heel and great toe pain. Your discharge medications include Tylenol and ibuprofen.  You may take Tylenol for pain.  You may take this every 8 hours.  You may also take ibuprofen, but you do have a diagnosis of chronic kidney disease and should take this sparingly. Follow up with: Your primary care provider in 1 week Please seek immediate medical care if you develop any of the following symptoms: Your foot, leg, toes, or ankle: Tingles or becomes numb. Becomes swollen. Turns pale or blue. At this time there does not appear to be the presence of an emergent medical condition, however there is always the potential for conditions to change. Please read and follow the below instructions.  Do not take your medicine if  develop an itchy rash, swelling in your mouth or lips, or difficulty breathing; call 911 and seek immediate emergency medical attention if this occurs.  You may review your lab tests and imaging results in their entirety on your MyChart account.  Please discuss all results of fully with your primary care provider and other specialist at your follow-up visit.  Note: Portions of this text may have been transcribed using voice recognition software. Every effort was made to ensure accuracy; however, inadvertent computerized transcription errors may still be present.
